# Patient Record
Sex: Male | Born: 1975 | Race: Black or African American | Hispanic: No | Marital: Single | Smoking: Current every day smoker
Health system: Southern US, Community
[De-identification: ages and names within clinical notes are randomized; demographics above are authoritative.]

## PROBLEM LIST (undated history)

## (undated) DIAGNOSIS — I10 Essential (primary) hypertension: Secondary | ICD-10-CM

## (undated) DIAGNOSIS — E119 Type 2 diabetes mellitus without complications: Secondary | ICD-10-CM

## (undated) DIAGNOSIS — E785 Hyperlipidemia, unspecified: Secondary | ICD-10-CM

## (undated) HISTORY — DX: Type 2 diabetes mellitus without complications: E11.9

## (undated) HISTORY — DX: Essential (primary) hypertension: I10

## (undated) HISTORY — DX: Hyperlipidemia, unspecified: E78.5

---

## 2011-09-03 DIAGNOSIS — I10 Essential (primary) hypertension: Secondary | ICD-10-CM | POA: Diagnosis not present

## 2011-09-03 DIAGNOSIS — E785 Hyperlipidemia, unspecified: Secondary | ICD-10-CM | POA: Diagnosis not present

## 2011-09-03 DIAGNOSIS — E559 Vitamin D deficiency, unspecified: Secondary | ICD-10-CM | POA: Diagnosis not present

## 2011-09-03 DIAGNOSIS — J45909 Unspecified asthma, uncomplicated: Secondary | ICD-10-CM | POA: Diagnosis not present

## 2011-09-13 DIAGNOSIS — M25569 Pain in unspecified knee: Secondary | ICD-10-CM | POA: Diagnosis not present

## 2011-09-13 DIAGNOSIS — J209 Acute bronchitis, unspecified: Secondary | ICD-10-CM | POA: Diagnosis not present

## 2011-09-17 DIAGNOSIS — E1149 Type 2 diabetes mellitus with other diabetic neurological complication: Secondary | ICD-10-CM | POA: Diagnosis not present

## 2011-09-17 DIAGNOSIS — E119 Type 2 diabetes mellitus without complications: Secondary | ICD-10-CM | POA: Diagnosis not present

## 2011-12-10 DIAGNOSIS — E785 Hyperlipidemia, unspecified: Secondary | ICD-10-CM | POA: Diagnosis not present

## 2011-12-10 DIAGNOSIS — E559 Vitamin D deficiency, unspecified: Secondary | ICD-10-CM | POA: Diagnosis not present

## 2011-12-10 DIAGNOSIS — E119 Type 2 diabetes mellitus without complications: Secondary | ICD-10-CM | POA: Diagnosis not present

## 2011-12-10 DIAGNOSIS — R7989 Other specified abnormal findings of blood chemistry: Secondary | ICD-10-CM | POA: Diagnosis not present

## 2011-12-12 DIAGNOSIS — E1149 Type 2 diabetes mellitus with other diabetic neurological complication: Secondary | ICD-10-CM | POA: Diagnosis not present

## 2011-12-12 DIAGNOSIS — E119 Type 2 diabetes mellitus without complications: Secondary | ICD-10-CM | POA: Diagnosis not present

## 2012-01-10 DIAGNOSIS — M2390 Unspecified internal derangement of unspecified knee: Secondary | ICD-10-CM | POA: Diagnosis not present

## 2012-02-15 DIAGNOSIS — M25569 Pain in unspecified knee: Secondary | ICD-10-CM | POA: Diagnosis not present

## 2012-02-20 DIAGNOSIS — L851 Acquired keratosis [keratoderma] palmaris et plantaris: Secondary | ICD-10-CM | POA: Diagnosis not present

## 2012-02-20 DIAGNOSIS — E1149 Type 2 diabetes mellitus with other diabetic neurological complication: Secondary | ICD-10-CM | POA: Diagnosis not present

## 2012-03-07 DIAGNOSIS — M2390 Unspecified internal derangement of unspecified knee: Secondary | ICD-10-CM | POA: Diagnosis not present

## 2012-03-10 DIAGNOSIS — G4730 Sleep apnea, unspecified: Secondary | ICD-10-CM | POA: Diagnosis not present

## 2012-03-10 DIAGNOSIS — E785 Hyperlipidemia, unspecified: Secondary | ICD-10-CM | POA: Diagnosis not present

## 2012-03-10 DIAGNOSIS — E119 Type 2 diabetes mellitus without complications: Secondary | ICD-10-CM | POA: Diagnosis not present

## 2014-03-01 ENCOUNTER — Telehealth: Payer: Self-pay | Admitting: Physician Assistant

## 2014-03-02 NOTE — Telephone Encounter (Signed)
appt scheduled. Patient is not considered new to the practice since it has not been 3 years

## 2014-03-03 DIAGNOSIS — E119 Type 2 diabetes mellitus without complications: Secondary | ICD-10-CM | POA: Diagnosis not present

## 2014-03-03 DIAGNOSIS — L851 Acquired keratosis [keratoderma] palmaris et plantaris: Secondary | ICD-10-CM | POA: Diagnosis not present

## 2014-03-03 DIAGNOSIS — E1149 Type 2 diabetes mellitus with other diabetic neurological complication: Secondary | ICD-10-CM | POA: Diagnosis not present

## 2014-03-03 DIAGNOSIS — B351 Tinea unguium: Secondary | ICD-10-CM | POA: Diagnosis not present

## 2014-03-09 ENCOUNTER — Encounter: Payer: Self-pay | Admitting: Family

## 2014-03-09 ENCOUNTER — Ambulatory Visit (INDEPENDENT_AMBULATORY_CARE_PROVIDER_SITE_OTHER): Payer: Medicare Other | Admitting: Family

## 2014-03-09 VITALS — BP 128/78 | HR 85 | Temp 99.3°F | Ht 67.0 in | Wt 254.0 lb

## 2014-03-09 DIAGNOSIS — Z23 Encounter for immunization: Secondary | ICD-10-CM

## 2014-03-09 DIAGNOSIS — Z0000 Encounter for general adult medical examination without abnormal findings: Secondary | ICD-10-CM

## 2014-03-09 DIAGNOSIS — E119 Type 2 diabetes mellitus without complications: Secondary | ICD-10-CM | POA: Diagnosis not present

## 2014-03-09 DIAGNOSIS — E1165 Type 2 diabetes mellitus with hyperglycemia: Secondary | ICD-10-CM

## 2014-03-09 DIAGNOSIS — E559 Vitamin D deficiency, unspecified: Secondary | ICD-10-CM | POA: Diagnosis not present

## 2014-03-09 LAB — POCT GLYCOSYLATED HEMOGLOBIN (HGB A1C): Hemoglobin A1C: 7.6

## 2014-03-09 MED ORDER — METFORMIN HCL 500 MG PO TABS: 500.0000 mg | ORAL_TABLET | Freq: Two times a day (BID) | ORAL | Status: DC

## 2014-03-09 NOTE — Progress Notes (Signed)
   Subjective:    Patient ID: Phillip Barrett, male    DOB: 25-Feb-1976, 38 y.o.   MRN: [id number]  Diabetes He presents for his follow-up diabetic visit. He has type 2 diabetes mellitus. His disease course has been fluctuating. Pertinent negatives for hypoglycemia include no confusion or dizziness. Pertinent negatives for diabetes include no foot paresthesias and no visual change. Symptoms are worsening. Pertinent negatives for diabetic complications include no nephropathy or peripheral neuropathy. Risk factors for coronary artery disease include diabetes mellitus, male sex, sedentary lifestyle, tobacco exposure and dyslipidemia. When asked about current treatments, none were reported. He is following a generally unhealthy diet. He participates in exercise daily. An ACE inhibitor/angiotensin II receptor blocker is not being taken. He sees a podiatrist (One week ago).  *Pt was in prison for two months. Pt states he has been out for the last month and has not taken any medications.    Review of Systems  Constitutional: Negative.   HENT: Negative.   Respiratory: Negative.   Cardiovascular: Negative.   Gastrointestinal: Negative.   Endocrine: Negative.   Genitourinary: Negative.   Musculoskeletal: Negative.   Neurological: Negative.  Negative for dizziness.  Hematological: Negative.   Psychiatric/Behavioral: Negative.  Negative for confusion.  All other systems reviewed and are negative. *     Objective:   Physical Exam  Vitals reviewed. Constitutional: He is oriented to person, place, and time. He appears well-developed and well-nourished. No distress.  HENT:  Head: Normocephalic.  Right Ear: External ear normal.  Left Ear: External ear normal.  Nose: Nose normal.  Mouth/Throat: Oropharynx is clear and moist.  Eyes: Pupils are equal, round, and reactive to light. Right eye exhibits no discharge. Left eye exhibits no discharge.  Neck: Normal range of motion. Neck supple. No thyromegaly  present.  Cardiovascular: Normal rate, regular rhythm, normal heart sounds and intact distal pulses.   No murmur heard. Pulmonary/Chest: Effort normal and breath sounds normal. No respiratory distress. He has no wheezes.  Abdominal: Soft. Bowel sounds are normal. He exhibits no distension. There is no tenderness.  Musculoskeletal: Normal range of motion. He exhibits no edema and no tenderness.  Neurological: He is alert and oriented to person, place, and time. He has normal reflexes. No cranial nerve deficit.  Skin: Skin is warm and dry. No rash noted. No erythema.  Psychiatric: He has a normal mood and affect. His behavior is normal. Judgment and thought content normal.    BP 128/78  Pulse 85  Temp(Src) 99.3 F (37.4 C) (Oral)  Ht _0  (1.702 m)  Wt 254 lb (115.214 kg)  BMI 39.77 kg/m2  See diabetic foot note     Assessment & Plan:  1. Type 2 diabetes mellitus with hyperglycemia - POCT glycosylated hemoglobin (Hb A1C) - CMP14+EGFR - metFORMIN (GLUCOPHAGE) 500 MG tablet; Take 1 tablet (500 mg total) by mouth 2 (two) times daily with a meal.  Dispense: 180 tablet; Refill: 3  2. Annual physical exam - Lipid panel - Vit D  25 hydroxy (rtn osteoporosis monitoring)   Continue all meds Labs pending Health Maintenance reviewed Diet and exercise encouraged RTO 3 month and pt needs appointment with Tammy  Evelina Dun, FNP

## 2014-03-09 NOTE — Patient Instructions (Signed)
How to Avoid Diabetes Problems You can do a lot to prevent or slow down diabetes problems. Following your diabetes plan and taking care of yourself can reduce your risk of serious or life-threatening complications. Below, you will find certain things you can do to prevent diabetes problems. MANAGE YOUR DIABETES Follow your health care provider's, nurse educator's, and dietitian's instructions for managing your diabetes. They will teach you the basics of diabetes care. They can help answer questions you may have. Learn about diabetes and make healthy choices regarding eating and physical activity. Monitor your blood glucose level regularly. Your health care provider will help you decide how often to check your blood glucose level depending on your treatment goals and how well you are meeting them.  DO NOT USE NICOTINE Nicotine and diabetes are a dangerous combination. Nicotine raises your risk for diabetes problems. If you quit using nicotine, you will lower your risk for heart attack, stroke, nerve disease, and kidney disease. Your cholesterol and your blood pressure levels may improve. Your blood circulation will also improve. Do not use any tobacco products, including cigarettes, chewing tobacco, or electronic cigarettes. If you need help quitting, ask your health care provider. KEEP YOUR BLOOD PRESSURE UNDER CONTROL Keeping your blood pressure under control will help prevent damage to your eyes, kidneys, heart, and blood vessels. Blood pressure consists of two numbers. The top number should be below 120, and the bottom number should be below 80 (120/80). Keep your blood pressure as close to these numbers as you can. If you already have kidney disease, you may want even lower blood pressure to protect your kidneys. Talk to your health care provider to make sure that your blood pressure goal is right for your needs. Meal planning, medicines, and exercise can help you reach your blood pressure target. Have  your blood pressure checked at every visit with your health care provider. KEEP YOUR CHOLESTEROL UNDER CONTROL Normal cholesterol levels will help prevent heart disease and stroke. These are the biggest health problems for people with diabetes. Keeping cholesterol levels under control can also help with blood flow. Have your cholesterol level checked at least once a year. Your health care provider may prescribe a medicine known as a statin. Statins lower your cholesterol. If you are not taking a statin, ask your health care provider if you should be. Meal planning, exercise, and medicines can help you reach your cholesterol targets.  SCHEDULE AND KEEP YOUR ANNUAL PHYSICAL EXAMS AND EYE EXAMS Your health care provider will tell you how often he or she wants to see you depending on your plan of treatment. It is important that you keep these appointments so that possible problems can be identified early and complications can be avoided or treated.  Every visit with your health care provider should include your weight, blood pressure, and an evaluation of your blood glucose control.  Your hemoglobin A1c should be checked:  At least twice a year if you are at your goal.  Every 3 months if there are changes in treatment.  If you are not meeting your goals.  Your blood lipids should be checked yearly. You should also be checked yearly to see if you have protein in your urine (microalbumin).  Schedule a dilated eye exam within 5 years of your diagnosis if you have type 1 diabetes, and then yearly. Schedule a dilated eye exam at diagnosis if you have type 2 diabetes, and then yearly. All exams thereafter can be extended to every 2  to 3 years if one or more exams have been normal. KEEP YOUR VACCINES CURRENT The flu vaccine is recommended yearly. The formula for the vaccine changes every year and needs to be updated for the best protection against current viruses. It is recommended that people with diabetes  who are over 38 years old get the pneumonia vaccine. In some cases, two separate shots may be given. Ask your health care provider if your pneumonia vaccination is up-to-date. However, there are some instances where another vaccine is recommended. Check with your health care provider. TAKE CARE OF YOUR FEET  Diabetes may cause you to have a poor blood supply (circulation) to your legs and feet. Because of this, the skin may be thinner, break easier, and heal more slowly. You also may have nerve damage in your legs and feet, causing decreased feeling. You may not notice minor injuries to your feet that could lead to serious problems or infections. Taking care of your feet is very important. Visual foot exams are performed at every routine medical visit. The exams check for cuts, injuries, or other problems with the feet. A comprehensive foot exam should be done yearly. This includes visual inspection as well as assessing foot pulses and testing for loss of sensation. You should also do the following:  Inspect your feet daily for cuts, calluses, blisters, ingrown toenails, and signs of infection, such as redness, swelling, or pus.  Wash and dry your feet thoroughly, especially between the toes.  Avoid soaking your feet regularly in hot water baths.  Moisturize dry skin with lotion, avoiding areas between your toes.  Cut toenails straight across and file the edges.  Avoid shoes that do not fit well or have areas that irritate your skin.  Avoid going barefooted or wearing only socks. Your feet need protection. TAKE CARE OF YOUR TEETH People with poorly controlled diabetes are more likely to have gum (periodontal) disease. These infections make diabetes harder to control. Periodontal diseases, if left untreated, can lead to tooth loss. Brush your teeth twice a day, floss, and see your dentist for checkups and cleaning every 6 months, or 2 times a year. ASK YOUR HEALTH CARE PROVIDER ABOUT TAKING  ASPIRIN Taking aspirin daily is recommended to help prevent cardiovascular disease in people with and without diabetes. Ask your health care provider if this would benefit you and what dose he or she would recommend. DRINK RESPONSIBLY Moderate amounts of alcohol (less than 1 drink per day for adult women and less than 2 drinks per day for adult men) have a minimal effect on blood glucose if ingested with food. It is important to eat food with alcohol to avoid hypoglycemia. People should avoid alcohol if they have a history of alcohol abuse or dependence, if they are pregnant, and if they have liver disease, pancreatitis, advanced neuropathy, or severe hypertriglyceridemia. LESSEN STRESS Living with diabetes can be stressful. When you are under stress, your blood glucose may be affected in two ways:  Stress hormones may cause your blood glucose to rise.  You may be distracted from taking good care of yourself. It is a good idea to be aware of your stress level and make changes that are necessary to help you better manage challenging situations. Support groups, planned relaxation, a hobby you enjoy, meditation, healthy relationships, and exercise all work to lower your stress level. If your efforts do not seem to be helping, get help from your health care provider or a trained mental health professional. Document  Released: 04/03/2011 Document Revised: 11/30/2013 Document Reviewed: 09/09/2013 [facility] Patient Information 2015 [place], [place]. This information is not intended to replace advice given to you by your health care provider. Make sure you discuss any questions you have with your health care provider.

## 2014-03-09 NOTE — Addendum Note (Signed)
Addended by: Zannie Cove on: 03/09/2014 03:31 PM   Modules accepted: Orders

## 2014-03-10 ENCOUNTER — Other Ambulatory Visit: Payer: Self-pay | Admitting: Family

## 2014-03-10 LAB — CMP14+EGFR
ALBUMIN: 4.3 g/dL (ref 3.5–5.5)
ALK PHOS: 65 IU/L (ref 39–117)
ALT: 25 IU/L (ref 0–44)
AST: 19 IU/L (ref 0–40)
Albumin/Globulin Ratio: 1.4 (ref 1.1–2.5)
BILIRUBIN TOTAL: 0.2 mg/dL (ref 0.0–1.2)
BUN/Creatinine Ratio: 10 (ref 8–19)
BUN: 9 mg/dL (ref 6–20)
CHLORIDE: 97 mmol/L (ref 97–108)
CO2: 23 mmol/L (ref 18–29)
Calcium: 9.3 mg/dL (ref 8.7–10.2)
Creatinine, Ser: 0.87 mg/dL (ref 0.76–1.27)
GFR calc non Af Amer: 109 mL/min/{1.73_m2} (ref >59)
GFR, EST AFRICAN AMERICAN: 127 mL/min/{1.73_m2} (ref >59)
Globulin, Total: 3 g/dL (ref 1.5–4.5)
Glucose: 272 mg/dL — ABNORMAL HIGH (ref 65–99)
POTASSIUM: 4.7 mmol/L (ref 3.5–5.2)
Sodium: 135 mmol/L (ref 134–144)
Total Protein: 7.3 g/dL (ref 6.0–8.5)

## 2014-03-10 LAB — LIPID PANEL
CHOLESTEROL TOTAL: 187 mg/dL (ref 100–199)
Chol/HDL Ratio: 3.5 ratio units (ref 0.0–5.0)
HDL: 53 mg/dL (ref >39)
LDL CALC: 117 mg/dL — AB (ref 0–99)
Triglycerides: 86 mg/dL (ref 0–149)
VLDL Cholesterol Cal: 17 mg/dL (ref 5–40)

## 2014-03-10 LAB — VITAMIN D 25 HYDROXY (VIT D DEFICIENCY, FRACTURES): Vit D, 25-Hydroxy: 23.9 ng/mL — ABNORMAL LOW (ref 30.0–100.0)

## 2014-03-10 MED ORDER — SIMVASTATIN 20 MG PO TABS: 20.0000 mg | ORAL_TABLET | Freq: Every day | ORAL | Status: DC

## 2014-03-10 MED ORDER — VITAMIN D (ERGOCALCIFEROL) 1.25 MG (50000 UNIT) PO CAPS: 50000.0000 [IU] | ORAL_CAPSULE | ORAL | Status: DC

## 2014-03-11 ENCOUNTER — Telehealth: Payer: Self-pay | Admitting: Family Medicine

## 2014-03-11 NOTE — Telephone Encounter (Signed)
Message copied by Waverly Ferrari on Thu Mar 11, 2014  9:33 AM ------      Message from: Lenna Gilford, [place] A      Created: Wed Mar 10, 2014 10:39 AM       HgbA1 and blood sugar  Elevated-However, pt has been out of medication for last month- Start taking medication and will check on next visit before adding any new medications      Kidney and liver function stable      Cholesterol levels high- RX sent to pharmacy      Vit D levels low- RX sent to pharmacy ------

## 2014-03-18 ENCOUNTER — Encounter: Payer: Self-pay | Admitting: Family Medicine

## 2014-03-24 ENCOUNTER — Telehealth: Payer: Self-pay | Admitting: Family

## 2014-03-24 NOTE — Telephone Encounter (Signed)
Patient received letter about labs and wanted to review. Discussed labs with patient. Verbalized understanding

## 2014-04-01 ENCOUNTER — Encounter: Payer: Self-pay | Admitting: Pharmacist

## 2014-04-01 ENCOUNTER — Ambulatory Visit (INDEPENDENT_AMBULATORY_CARE_PROVIDER_SITE_OTHER): Payer: Medicare Other | Admitting: Pharmacist

## 2014-04-01 VITALS — BP 130/80 | HR 87 | Ht 67.0 in | Wt 261.0 lb

## 2014-04-01 DIAGNOSIS — IMO0001 Reserved for inherently not codable concepts without codable children: Secondary | ICD-10-CM

## 2014-04-01 DIAGNOSIS — E782 Mixed hyperlipidemia: Secondary | ICD-10-CM

## 2014-04-01 DIAGNOSIS — R635 Abnormal weight gain: Secondary | ICD-10-CM | POA: Diagnosis not present

## 2014-04-01 DIAGNOSIS — E785 Hyperlipidemia, unspecified: Secondary | ICD-10-CM | POA: Insufficient documentation

## 2014-04-01 DIAGNOSIS — E669 Obesity, unspecified: Secondary | ICD-10-CM

## 2014-04-01 DIAGNOSIS — E1165 Type 2 diabetes mellitus with hyperglycemia: Secondary | ICD-10-CM

## 2014-04-01 MED ORDER — FREESTYLE SYSTEM KIT: 1.0000 | PACK | Status: DC | PRN

## 2014-04-01 MED ORDER — FREESTYLE SYSTEM KIT: PACK | Status: DC

## 2014-04-01 MED ORDER — LANCETS MICRO THIN 33G MISC: Status: DC

## 2014-04-01 MED ORDER — BLOOD GLUCOSE TEST VI STRP: ORAL_STRIP | Status: DC

## 2014-04-01 NOTE — Patient Instructions (Signed)
Diabetes and Standards of Medical Care  Diabetes is complicated. You may find that your diabetes team includes a dietitian, nurse, diabetes educator, eye doctor, and more. To help everyone know what is going on and to help you get the care you deserve, the following schedule of care was developed to help keep you on track. Below are the tests, exams, vaccines, medicines, education, and plans you will need.  Blood Glucose Goals Prior to meals = 80 - 130 Within 2 hours of the start of a meal = less than 180  HbA1c test (goal is less than 7.0% - your last value was 7.6%) This test shows how well you have controlled your glucose over the past 2 3 months. It is used to see if your diabetes management plan needs to be adjusted.   It is performed at least 2 times a year if you are meeting treatment goals.  It is performed 4 times a year if therapy has changed or if you are not meeting treatment goals.   Blood pressure test  This test is performed at every routine medical visit. The goal is less than 140/90 mmHg for most people, but 130/80 mmHg in some cases. Ask your health care provider about your goal. Dental exam  Follow up with the dentist regularly. Eye exam  If you are diagnosed with type 1 diabetes as a child, get an exam upon reaching the age of 53 years or older and have had diabetes for 3 5 years. Yearly eye exams are recommended after that initial eye exam.  If you are diagnosed with type 1 diabetes as an adult, get an exam within 5 years of diagnosis and then yearly.  If you are diagnosed with type 2 diabetes, get an exam as soon as possible after the diagnosis and then yearly. Foot care exam  Visual foot exams are performed at every routine medical visit. The exams check for cuts, injuries, or other problems with the feet.  A comprehensive foot exam should be done yearly. This includes visual inspection as well as assessing foot pulses and testing for loss of sensation.  Check  your feet nightly for cuts, injuries, or other problems with your feet. Tell your health care provider if anything is not healing. Kidney function test (urine microalbumin)  This test is performed once a year.  Type 1 diabetes: The first test is performed 5 years after diagnosis.  Type 2 diabetes: The first test is performed at the time of diagnosis.  A serum creatinine and estimated glomerular filtration rate (eGFR) test is done once a year to assess the level of chronic kidney disease (CKD), if present. Lipid profile (cholesterol, HDL, LDL, triglycerides)  Performed every 5 years for most people.  The goal for LDL is less than 100 mg/dL. If you are at high risk, the goal is less than 70 mg/dL.  The goal for HDL is 40 mg/dL 50 mg/dL for men and 50 mg/dL 60 mg/dL for women. An HDL cholesterol of 60 mg/dL or higher gives some protection against heart disease.  The goal for triglycerides is less than 150 mg/dL. Influenza vaccine, pneumococcal vaccine, and hepatitis B vaccine  The influenza vaccine is recommended yearly.  The pneumococcal vaccine is generally given once in a lifetime. However, there are some instances when another vaccination is recommended. Check with your health care provider.  The hepatitis B vaccine is also recommended for adults with diabetes. Diabetes self-management education  Education is recommended at diagnosis and ongoing  as needed. Treatment plan  Your treatment plan is reviewed at every medical visit. Document Released: 05/13/2009 Document Revised: 03/18/2013 Document Reviewed: 12/16/2012 [facility] [address] Patient Information 2014 [place].  Hypoglycemia Hypoglycemia occurs when the glucose in your blood is too low. Glucose is a type of sugar that is your body's main energy source. Hormones, such as insulin and glucagon, control the level of glucose in the blood. Insulin lowers blood glucose and glucagon increases blood glucose. Having too much insulin in  your blood stream, or not eating enough food containing sugar, can result in hypoglycemia. Hypoglycemia can happen to people with or without diabetes. It can develop quickly and can be a medical emergency.  CAUSES   Missing or delaying meals.  Not eating enough carbohydrates at meals.  Taking too much diabetes medicine.  Not timing your oral diabetes medicine or insulin doses with meals, snacks, and exercise.  Nausea and vomiting.  Certain medicines.  Severe illnesses, such as hepatitis, kidney disorders, and certain eating disorders.  Increased activity or exercise without eating something extra or adjusting medicines.  Drinking too much alcohol.  A nerve disorder that affects body functions like your heart rate, blood pressure, and digestion (autonomic neuropathy).  A condition where the stomach muscles do not function properly (gastroparesis). Therefore, medicines and food may not absorb properly.  Rarely, a tumor of the pancreas can produce too much insulin. SYMPTOMS   Hunger.  Sweating (diaphoresis).  Change in body temperature.  Shakiness.  Headache.  Anxiety.  Lightheadedness.  Irritability.  Difficulty concentrating.  Dry mouth.  Tingling or numbness in the hands or feet.  Restless sleep or sleep disturbances.  Altered speech and coordination.  Change in mental status.  Seizures or prolonged convulsions.  Combativeness.  Drowsiness (lethargic).  Weakness.  Increased heart rate or palpitations.  Confusion.  Pale, gray skin color.  Blurred or double vision.  Fainting. DIAGNOSIS  A physical exam and medical history will be performed. Your caregiver may make a diagnosis based on your symptoms. Blood tests and other lab tests may be performed to confirm a diagnosis. Once the diagnosis is made, your caregiver will see if your signs and symptoms go away once your blood glucose is raised.  TREATMENT  Usually, you can easily treat your  hypoglycemia when you notice symptoms.  Check your blood glucose. If it is less than 70 mg/dl, take one of the following:   3-4 glucose tablets.    cup juice.    cup regular soda.   1 cup skim milk.   -1 tube of glucose gel.   5-6 hard candies.   Avoid high-fat drinks or food that may delay a rise in blood glucose levels.  Do not take more than the recommended amount of sugary foods, drinks, gel, or tablets. Doing so will cause your blood glucose to go too high.   Wait 10-15 minutes and recheck your blood glucose. If it is still less than 70 mg/dl or below your target range, repeat treatment.   Eat a snack if it is more than 1 hour until your next meal.  There may be a time when your blood glucose may go so low that you are unable to treat yourself at home when you start to notice symptoms. You may need someone to help you. You may even faint or be unable to swallow. If you cannot treat yourself, someone will need to bring you to the hospital.  [place]  If you have diabetes, follow your  diabetes management plan by:  Taking your medicines as directed.  Following your exercise plan.  Following your meal plan. Do not skip meals. Eat on time.  Testing your blood glucose regularly. Check your blood glucose before and after exercise. If you exercise longer or different than usual, be sure to check blood glucose more frequently.  Wearing your medical alert jewelry that says you have diabetes.  Identify the cause of your hypoglycemia. Then, develop ways to prevent the recurrence of hypoglycemia.  Do not take a hot bath or shower right after an insulin shot.  Always carry treatment with you. Glucose tablets are the easiest to carry.  If you are going to drink alcohol, drink it only with meals.  Tell friends or family members ways to keep you safe during a seizure. This may include removing hard or sharp objects from the area or turning you on your  side.  Maintain a healthy weight. SEEK MEDICAL CARE IF:   You are having problems keeping your blood glucose in your target range.  You are having frequent episodes of hypoglycemia.  You feel you might be having side effects from your medicines.  You are not sure why your blood glucose is dropping so low.  You notice a change in vision or a new problem with your vision. SEEK IMMEDIATE MEDICAL CARE IF:   Confusion develops.  A change in mental status occurs.  The inability to swallow develops.  Fainting occurs. Document Released: 07/16/2005 Document Revised: 07/21/2013 Document Reviewed: 11/12/2011 [facility] Patient Information 2015 [place], [place]. This information is not intended to replace advice given to you by your health care provider. Make sure you discuss any questions you have with your health care provider.

## 2014-04-01 NOTE — Progress Notes (Signed)
Subjective:    Phillip Barrett is a 38 y.o. male who presents for evaluation of Type 2 diabetes mellitus and diabetes education. Current symptoms/problems include hyperglycemia and polydipsia and have been worsening. Symptoms have been present for 4 months.  The patient was initially diagnosed with Type 2 diabetes mellitus over 10 years ago.  He has been incarcerated over the last year and not had regular follow up visits.  Known diabetic complications: peripheral neuropathy Cardiovascular risk factors: diabetes mellitus, dyslipidemia, male gender and obesity (BMI >= 30 kg/m2) Current diabetic medications include metformin 500mg  bid - recently restarted about 3 weeks ago..   Eye exam current (within one year): no Weight trend: increasing steadily Prior visit with dietician: yes -  Over 5 years ago Current diet: in general, an "unhealthy" diet.  Drinking lots of soda. Current exercise: none  Current monitoring regimen: none.  Patient has a glucometer but no test strips. Home blood sugar records: none Any episodes of hypoglycemia? no  Is He on ACE inhibitor or angiotensin II receptor blocker?  No   The following portions of the patient's history were reviewed and updated as appropriate: allergies, current medications, past family history, past medical history, past social history, past surgical history and problem list.     Objective:    BP 130/80  Pulse 87  Ht 5\' 7"  (1.702 m)  Wt 261 lb (118.389 kg)  BMI 40.87 kg/m2   Lab Review Glucose (mg/dL)  Date Value  03/09/2014 272*     CO2 (mmol/L)  Date Value  03/09/2014 23      BUN (mg/dL)  Date Value  03/09/2014 9      Creatinine, Ser (mg/dL)  Date Value  03/09/2014 0.87    A1c was 7.6% (03/09/2014)  Assessment:    Diabetes Mellitus type II, under poor control.  Hyperlipidemia - restarted simvastatin 3 weeks ago Obesity - current poor diet   Plan:    1.  Rx changes: none 2.  Education: Reviewed 'ABCs' of diabetes  management (respective goals in parentheses):  A1C (<7), blood pressure (<130/80), and cholesterol (LDL <100). 3.  Compliance at present is estimated to be poor.   Efforts to improve compliance (if necessary) will be directed at dietary modifications: CHO counting diet reviewed and discussed at length (about 20 minutes), increased exercise and regular blood sugar monitoring: two times daily 4. Follow up: 8 weeks    Cherre Robins, PharmD, CPP, CDE

## 2014-04-07 ENCOUNTER — Telehealth: Payer: Self-pay | Admitting: Family

## 2014-04-09 ENCOUNTER — Encounter: Payer: Self-pay | Admitting: *Deleted

## 2014-04-15 NOTE — Telephone Encounter (Signed)
Braces forms faxed in 04/12/14

## 2014-04-26 ENCOUNTER — Ambulatory Visit (INDEPENDENT_AMBULATORY_CARE_PROVIDER_SITE_OTHER): Payer: Medicare Other

## 2014-04-26 DIAGNOSIS — Z23 Encounter for immunization: Secondary | ICD-10-CM | POA: Diagnosis not present

## 2014-05-12 DIAGNOSIS — E1142 Type 2 diabetes mellitus with diabetic polyneuropathy: Secondary | ICD-10-CM | POA: Diagnosis not present

## 2014-05-12 DIAGNOSIS — B351 Tinea unguium: Secondary | ICD-10-CM | POA: Diagnosis not present

## 2014-05-12 DIAGNOSIS — L110 Acquired keratosis follicularis: Secondary | ICD-10-CM | POA: Diagnosis not present

## 2014-05-12 DIAGNOSIS — E1140 Type 2 diabetes mellitus with diabetic neuropathy, unspecified: Secondary | ICD-10-CM | POA: Diagnosis not present

## 2014-06-08 ENCOUNTER — Telehealth: Payer: Self-pay | Admitting: Family Medicine

## 2014-06-08 DIAGNOSIS — S0990XA Unspecified injury of head, initial encounter: Secondary | ICD-10-CM | POA: Diagnosis not present

## 2014-06-08 DIAGNOSIS — E1165 Type 2 diabetes mellitus with hyperglycemia: Secondary | ICD-10-CM | POA: Diagnosis not present

## 2014-06-08 DIAGNOSIS — S40012A Contusion of left shoulder, initial encounter: Secondary | ICD-10-CM | POA: Diagnosis not present

## 2014-06-08 DIAGNOSIS — M25512 Pain in left shoulder: Secondary | ICD-10-CM | POA: Diagnosis not present

## 2014-06-08 DIAGNOSIS — Z79899 Other long term (current) drug therapy: Secondary | ICD-10-CM | POA: Diagnosis not present

## 2014-06-08 DIAGNOSIS — S199XXA Unspecified injury of neck, initial encounter: Secondary | ICD-10-CM | POA: Diagnosis not present

## 2014-06-08 DIAGNOSIS — S4992XA Unspecified injury of left shoulder and upper arm, initial encounter: Secondary | ICD-10-CM | POA: Diagnosis not present

## 2014-06-08 DIAGNOSIS — F17200 Nicotine dependence, unspecified, uncomplicated: Secondary | ICD-10-CM | POA: Diagnosis not present

## 2014-06-08 DIAGNOSIS — S0001XA Abrasion of scalp, initial encounter: Secondary | ICD-10-CM | POA: Diagnosis not present

## 2014-06-08 DIAGNOSIS — R51 Headache: Secondary | ICD-10-CM | POA: Diagnosis not present

## 2014-06-08 DIAGNOSIS — R739 Hyperglycemia, unspecified: Secondary | ICD-10-CM | POA: Diagnosis not present

## 2014-06-08 NOTE — Telephone Encounter (Signed)
Patient aware dr. Sabra Heck is not in the office on Thursday

## 2014-06-10 ENCOUNTER — Ambulatory Visit: Payer: Medicare Other | Admitting: Family Medicine

## 2014-06-11 ENCOUNTER — Encounter: Payer: Self-pay | Admitting: Family Medicine

## 2014-06-11 ENCOUNTER — Ambulatory Visit (INDEPENDENT_AMBULATORY_CARE_PROVIDER_SITE_OTHER): Payer: Medicare Other | Admitting: Family Medicine

## 2014-06-11 VITALS — BP 107/78 | HR 72 | Temp 98.6°F | Ht 67.0 in | Wt 260.0 lb

## 2014-06-11 DIAGNOSIS — E119 Type 2 diabetes mellitus without complications: Secondary | ICD-10-CM

## 2014-06-11 DIAGNOSIS — E785 Hyperlipidemia, unspecified: Secondary | ICD-10-CM

## 2014-06-11 DIAGNOSIS — E1165 Type 2 diabetes mellitus with hyperglycemia: Secondary | ICD-10-CM | POA: Diagnosis not present

## 2014-06-11 DIAGNOSIS — IMO0002 Reserved for concepts with insufficient information to code with codable children: Secondary | ICD-10-CM

## 2014-06-11 LAB — POCT GLYCOSYLATED HEMOGLOBIN (HGB A1C): HEMOGLOBIN A1C: 13.5

## 2014-06-11 MED ORDER — HYDROCODONE-ACETAMINOPHEN 5-300 MG PO TABS: 5.0000 mg | ORAL_TABLET | Freq: Two times a day (BID) | ORAL | Status: DC | PRN

## 2014-06-11 MED ORDER — LISINOPRIL 5 MG PO TABS: 5.0000 mg | ORAL_TABLET | Freq: Every day | ORAL | Status: DC

## 2014-06-11 NOTE — Addendum Note (Signed)
Addended by: Wardell Honour on: 06/11/2014 11:02 AM   Modules accepted: Orders

## 2014-06-11 NOTE — Patient Instructions (Signed)
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
## 2014-06-14 ENCOUNTER — Ambulatory Visit: Payer: Medicare Other | Admitting: Family Medicine

## 2014-06-14 ENCOUNTER — Telehealth: Payer: Self-pay | Admitting: Family Medicine

## 2014-06-14 NOTE — Telephone Encounter (Signed)
Pt given appt with Dietrich Pates Friday @ 10:30

## 2014-06-18 ENCOUNTER — Encounter: Payer: Self-pay | Admitting: Family Medicine

## 2014-06-18 ENCOUNTER — Ambulatory Visit (INDEPENDENT_AMBULATORY_CARE_PROVIDER_SITE_OTHER): Payer: Medicare Other | Admitting: Family Medicine

## 2014-06-18 ENCOUNTER — Telehealth: Payer: Self-pay | Admitting: Family Medicine

## 2014-06-18 DIAGNOSIS — M25561 Pain in right knee: Secondary | ICD-10-CM | POA: Diagnosis not present

## 2014-06-18 MED ORDER — NAPROXEN 500 MG PO TABS: 500.0000 mg | ORAL_TABLET | Freq: Two times a day (BID) | ORAL | Status: DC

## 2014-06-18 MED ORDER — CYCLOBENZAPRINE HCL 10 MG PO TABS: 10.0000 mg | ORAL_TABLET | Freq: Three times a day (TID) | ORAL | Status: DC | PRN
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
## 2014-06-18 NOTE — Telephone Encounter (Signed)
I advised patient that it was probably to help protect his kidneys from the diabetes but he wants to just verify this

## 2014-07-02 ENCOUNTER — Telehealth: Payer: Self-pay | Admitting: Family Medicine

## 2014-07-02 NOTE — Telephone Encounter (Signed)
Patient script for metformin was changed to 1000 mg BID   per Dr. Sabra Heck..   It had been 500 mg BID.

## 2014-07-28 DIAGNOSIS — B351 Tinea unguium: Secondary | ICD-10-CM | POA: Diagnosis not present

## 2014-07-28 DIAGNOSIS — E1142 Type 2 diabetes mellitus with diabetic polyneuropathy: Secondary | ICD-10-CM | POA: Diagnosis not present

## 2014-07-28 DIAGNOSIS — L110 Acquired keratosis follicularis: Secondary | ICD-10-CM | POA: Diagnosis not present

## 2014-09-13 ENCOUNTER — Ambulatory Visit: Payer: Medicare Other | Admitting: Family

## 2014-10-06 DIAGNOSIS — B351 Tinea unguium: Secondary | ICD-10-CM | POA: Diagnosis not present

## 2014-10-06 DIAGNOSIS — L110 Acquired keratosis follicularis: Secondary | ICD-10-CM | POA: Diagnosis not present

## 2014-10-06 DIAGNOSIS — E1142 Type 2 diabetes mellitus with diabetic polyneuropathy: Secondary | ICD-10-CM | POA: Diagnosis not present

## 2014-10-28 ENCOUNTER — Ambulatory Visit: Payer: Medicare Other | Admitting: Family

## 2014-11-10 ENCOUNTER — Ambulatory Visit: Payer: Medicare Other | Admitting: Family

## 2014-11-24 ENCOUNTER — Ambulatory Visit (INDEPENDENT_AMBULATORY_CARE_PROVIDER_SITE_OTHER): Payer: Medicare Other | Admitting: Family

## 2014-11-24 ENCOUNTER — Encounter: Payer: Self-pay | Admitting: Family

## 2014-11-24 VITALS — BP 112/67 | HR 71 | Temp 97.2°F | Ht 67.0 in | Wt 253.0 lb

## 2014-11-24 DIAGNOSIS — E119 Type 2 diabetes mellitus without complications: Secondary | ICD-10-CM

## 2014-11-24 DIAGNOSIS — M545 Low back pain, unspecified: Secondary | ICD-10-CM

## 2014-11-24 DIAGNOSIS — E1165 Type 2 diabetes mellitus with hyperglycemia: Secondary | ICD-10-CM

## 2014-11-24 DIAGNOSIS — E785 Hyperlipidemia, unspecified: Secondary | ICD-10-CM

## 2014-11-24 DIAGNOSIS — E559 Vitamin D deficiency, unspecified: Secondary | ICD-10-CM | POA: Diagnosis not present

## 2014-11-24 LAB — POCT UA - MICROALBUMIN: MICROALBUMIN (UR) POC: NEGATIVE mg/L

## 2014-11-24 LAB — POCT GLYCOSYLATED HEMOGLOBIN (HGB A1C): HEMOGLOBIN A1C: 6

## 2014-11-24 MED ORDER — MELOXICAM 15 MG PO TABS: 15.0000 mg | ORAL_TABLET | Freq: Every day | ORAL | Status: DC

## 2014-11-24 MED ORDER — METFORMIN HCL 500 MG PO TABS: 500.0000 mg | ORAL_TABLET | Freq: Two times a day (BID) | ORAL | Status: DC

## 2014-11-24 NOTE — Patient Instructions (Signed)
Type 2 Diabetes Mellitus Type 2 diabetes mellitus, often simply referred to as type 2 diabetes, is a long-lasting (chronic) disease. In type 2 diabetes, the pancreas does not make enough insulin (a hormone), the cells are less responsive to the insulin that is made (insulin resistance), or both. Normally, insulin moves sugars from food into the tissue cells. The tissue cells use the sugars for energy. The lack of insulin or the lack of normal response to insulin causes excess sugars to build up in the blood instead of going into the tissue cells. As a result, high blood sugar (hyperglycemia) develops. The effect of high sugar (glucose) levels can cause many complications. Type 2 diabetes was also previously called adult-onset diabetes, but it can occur at any age.  RISK FACTORS  A person is predisposed to developing type 2 diabetes if someone in the family has the disease and also has one or more of the following primary risk factors:  Overweight.  An inactive lifestyle.  A history of consistently eating high-calorie foods. Maintaining a normal weight and regular physical activity can reduce the chance of developing type 2 diabetes. SYMPTOMS  A person with type 2 diabetes may not show symptoms initially. The symptoms of type 2 diabetes appear slowly. The symptoms include:  Increased thirst (polydipsia).  Increased urination (polyuria).  Increased urination during the night (nocturia).  Weight loss. This weight loss may be rapid.  Frequent, recurring infections.  Tiredness (fatigue).  Weakness.  Vision changes, such as blurred vision.  Fruity smell to your breath.  Abdominal pain.  Nausea or vomiting.  Cuts or bruises which are slow to heal.  Tingling or numbness in the hands or feet. DIAGNOSIS Type 2 diabetes is frequently not diagnosed until complications of diabetes are present. Type 2 diabetes is diagnosed when symptoms or complications are present and when blood  glucose levels are increased. Your blood glucose level may be checked by one or more of the following blood tests:  A fasting blood glucose test. You will not be allowed to eat for at least 8 hours before a blood sample is taken.  A random blood glucose test. Your blood glucose is checked at any time of the day regardless of when you ate.  A hemoglobin A1c blood glucose test. A hemoglobin A1c test provides information about blood glucose control over the previous 3 months.  An oral glucose tolerance test (OGTT). Your blood glucose is measured after you have not eaten (fasted) for 2 hours and then after you drink a glucose-containing beverage. TREATMENT   You may need to take insulin or diabetes medicine daily to keep blood glucose levels in the desired range.  If you use insulin, you may need to adjust the dosage depending on the carbohydrates that you eat with each meal or snack. The treatment goal is to maintain the before meal blood sugar (preprandial glucose) level at 70-130 mg/dL. HOME CARE INSTRUCTIONS   Have your hemoglobin A1c level checked twice a year.  Perform daily blood glucose monitoring as directed by your health care provider.  Monitor urine ketones when you are ill and as directed by your health care provider.  Take your diabetes medicine or insulin as directed by your health care provider to maintain your blood glucose levels in the desired range.  Never run out of diabetes medicine or insulin. It is needed every day.  If you are using insulin, you may need to adjust the amount of insulin given based on your intake of   carbohydrates. Carbohydrates can raise blood glucose levels but need to be included in your diet. Carbohydrates provide vitamins, minerals, and fiber which are an essential part of a healthy diet. Carbohydrates are found in fruits, vegetables, whole grains, dairy products, legumes, and foods containing added sugars.  Eat healthy foods. You should make an  appointment to see a registered dietitian to help you create an eating plan that is right for you.  Lose weight if you are overweight.  Carry a medical alert card or wear your medical alert jewelry.  Carry a 15-gram carbohydrate snack with you at all times to treat low blood glucose (hypoglycemia). Some examples of 15-gram carbohydrate snacks include:  Glucose tablets, 3 or 4.  Glucose gel, 15-gram tube.  Raisins, 2 tablespoons (24 grams).  Jelly beans, 6.  Animal crackers, 8.  Regular pop, 4 ounces (120 mL).  Gummy treats, 9.  Recognize hypoglycemia. Hypoglycemia occurs with blood glucose levels of 70 mg/dL and below. The risk for hypoglycemia increases when fasting or skipping meals, during or after intense exercise, and during sleep. Hypoglycemia symptoms can include:  Tremors or shakes.  Decreased ability to concentrate.  Sweating.  Increased heart rate.  Headache.  Dry mouth.  Hunger.  Irritability.  Anxiety.  Restless sleep.  Altered speech or coordination.  Confusion.  Treat hypoglycemia promptly. If you are alert and able to safely swallow, follow the 15:15 rule:  Take 15-20 grams of rapid-acting glucose or carbohydrate. Rapid-acting options include glucose gel, glucose tablets, or 4 ounces (120 mL) of fruit juice, regular soda, or low-fat milk.  Check your blood glucose level 15 minutes after taking the glucose.  Take 15-20 grams more of glucose if the repeat blood glucose level is still 70 mg/dL or below.  Eat a meal or snack within 1 hour once blood glucose levels return to normal.  Be alert to feeling very thirsty and urinating more frequently than usual, which are early signs of hyperglycemia. An early awareness of hyperglycemia allows for prompt treatment. Treat hyperglycemia as directed by your health care provider.  Engage in at least 150 minutes of moderate-intensity physical activity a week, spread over at least 3 days of the week or as  directed by your health care provider. In addition, you should engage in resistance exercise at least 2 times a week or as directed by your health care provider. Try to spend no more than 90 minutes at one time inactive.  Adjust your medicine and food intake as needed if you start a new exercise or sport.  Follow your sick-day plan anytime you are unable to eat or drink as usual.  Do not use any tobacco products including cigarettes, chewing tobacco, or electronic cigarettes. If you need help quitting, ask your health care provider.  Limit alcohol intake to no more than 1 drink per day for nonpregnant women and 2 drinks per day for men. You should drink alcohol only when you are also eating food. Talk with your health care provider whether alcohol is safe for you. Tell your health care provider if you drink alcohol several times a week.  Keep all follow-up visits as directed by your health care provider. This is important.  Schedule an eye exam soon after the diagnosis of type 2 diabetes and then annually.  Perform daily skin and foot care. Examine your skin and feet daily for cuts, bruises, redness, nail problems, bleeding, blisters, or sores. A foot exam by a health care provider should be done annually.    Brush your teeth and gums at least twice a day and floss at least once a day. Follow up with your dentist regularly.  Share your diabetes management plan with your workplace or school.  Stay up-to-date with immunizations. It is recommended that people with diabetes who are over 57 years old get the pneumonia vaccine. In some cases, two separate shots may be given. Ask your health care provider if your pneumonia vaccination is up-to-date.  Learn to manage stress.  Obtain ongoing diabetes education and support as needed.  Participate in or seek rehabilitation as needed to maintain or improve independence and quality of life. Request a physical or occupational therapy referral if you are  having foot or hand numbness, or difficulties with grooming, dressing, eating, or physical activity. SEEK MEDICAL CARE IF:   You are unable to eat food or drink fluids for more than 6 hours.  You have nausea and vomiting for more than 6 hours.  Your blood glucose level is over 240 mg/dL.  There is a change in mental status.  You develop an additional serious illness.  You have diarrhea for more than 6 hours.  You have been sick or have had a fever for a couple of days and are not getting better.  You have pain during any physical activity.  SEEK IMMEDIATE MEDICAL CARE IF:  You have difficulty breathing.  You have moderate to large ketone levels. MAKE SURE YOU:  Understand these instructions.  Will watch your condition.  Will get help right away if you are not doing well or get worse. Document Released: 07/16/2005 Document Revised: 11/30/2013 Document Reviewed: 02/12/2012 [facility] Patient Information 2015 [place], [place]. This information is not intended to replace advice given to you by your health care provider. Make sure you discuss any questions you have with your health care provider. Diabetes Mellitus and Food It is important for you to manage your blood sugar (glucose) level. Your blood glucose level can be greatly affected by what you eat. Eating healthier foods in the appropriate amounts throughout the day at about the same time each day will help you control your blood glucose level. It can also help slow or prevent worsening of your diabetes mellitus. Healthy eating may even help you improve the level of your blood pressure and reach or maintain a healthy weight.  HOW CAN FOOD AFFECT ME? Carbohydrates Carbohydrates affect your blood glucose level more than any other type of food. Your dietitian will help you determine how many carbohydrates to eat at each meal and teach you how to count carbohydrates. Counting carbohydrates is important to keep your blood glucose at a  healthy level, especially if you are using insulin or taking certain medicines for diabetes mellitus. Alcohol Alcohol can cause sudden decreases in blood glucose (hypoglycemia), especially if you use insulin or take certain medicines for diabetes mellitus. Hypoglycemia can be a life-threatening condition. Symptoms of hypoglycemia (sleepiness, dizziness, and disorientation) are similar to symptoms of having too much alcohol.  If your health care provider has given you approval to drink alcohol, do so in moderation and use the following guidelines:  Women should not have more than one drink per day, and men should not have more than two drinks per day. One drink is equal to:  12 oz of beer.  5 oz of wine.  1 oz of hard liquor.  Do not drink on an empty stomach.  Keep yourself hydrated. Have water, diet soda, or unsweetened iced tea.  Regular soda, juice,  and other mixers might contain a lot of carbohydrates and should be counted. WHAT FOODS ARE NOT RECOMMENDED? As you make food choices, it is important to remember that all foods are not the same. Some foods have fewer nutrients per serving than other foods, even though they might have the same number of calories or carbohydrates. It is difficult to get your body what it needs when you eat foods with fewer nutrients. Examples of foods that you should avoid that are high in calories and carbohydrates but low in nutrients include:  Trans fats (most processed foods list trans fats on the Nutrition Facts label).  Regular soda.  Juice.  Candy.  Sweets, such as cake, pie, doughnuts, and cookies.  Fried foods. WHAT FOODS CAN I EAT? Have nutrient-rich foods, which will nourish your body and keep you healthy. The food you should eat also will depend on several factors, including:  The calories you need.  The medicines you take.  Your weight.  Your blood glucose level.  Your blood pressure level.  Your cholesterol level. You also  should eat a variety of foods, including:  Protein, such as meat, poultry, fish, tofu, nuts, and seeds (lean animal proteins are best).  Fruits.  Vegetables.  Dairy products, such as milk, cheese, and yogurt (low fat is best).  Breads, grains, pasta, cereal, rice, and beans.  Fats such as olive oil, trans fat-free margarine, canola oil, avocado, and olives. DOES EVERYONE WITH DIABETES MELLITUS HAVE THE SAME MEAL PLAN? Because every person with diabetes mellitus is different, there is not one meal plan that works for everyone. It is very important that you meet with a dietitian who will help you create a meal plan that is just right for you. Document Released: 04/12/2005 Document Revised: 07/21/2013 Document Reviewed: 06/12/2013 [facility] Patient Information 2015 [place], [place]. This information is not intended to replace advice given to you by your health care provider. Make sure you discuss any questions you have with your health care provider.

## 2014-11-24 NOTE — Progress Notes (Signed)
Subjective:    Patient ID: Phillip Barrett, male    DOB: [birth year], 39 y.o.   MRN: [id number]  Diabetes He presents for his follow-up diabetic visit. He has type 2 diabetes mellitus. His disease course has been fluctuating. Pertinent negatives for hypoglycemia include no confusion or dizziness. Pertinent negatives for diabetes include no blurred vision, no foot paresthesias, no foot ulcerations and no visual change. Symptoms are worsening. Pertinent negatives for diabetic complications include no nephropathy or peripheral neuropathy. Risk factors for coronary artery disease include diabetes mellitus, male sex, sedentary lifestyle, tobacco exposure and dyslipidemia. Current diabetic treatment includes oral agent (monotherapy). He is compliant with treatment all of the time. He is following a generally unhealthy diet. He participates in exercise daily. (Pt does not checked blood sugars ) An ACE inhibitor/angiotensin II receptor blocker is being taken (Pt states he has not taken this in awhile bc he could not afford it). He sees a podiatrist (One week ago).Eye exam is not current.  Hyperlipidemia This is a chronic problem. The current episode started more than 1 year ago. The problem is uncontrolled. Recent lipid tests were reviewed and are high. Exacerbating diseases include diabetes. He has no history of hypothyroidism. Pertinent negatives include no leg pain or myalgias. Current antihyperlipidemic treatment includes diet change. The current treatment provides mild improvement of lipids. Risk factors for coronary artery disease include diabetes mellitus, dyslipidemia, hypertension, male sex and obesity.      Review of Systems  Constitutional: Negative.   HENT: Negative.   Eyes: Negative for blurred vision.  Respiratory: Negative.   Cardiovascular: Negative.   Gastrointestinal: Negative.   Endocrine: Negative.   Genitourinary: Negative.   Musculoskeletal: Negative.  Negative for myalgias.    Neurological: Negative.  Negative for dizziness.  Hematological: Negative.   Psychiatric/Behavioral: Negative.  Negative for confusion.  All other systems reviewed and are negative.      Objective:   Physical Exam  Constitutional: He is oriented to person, place, and time. He appears well-developed and well-nourished. No distress.  HENT:  Head: Normocephalic.  Right Ear: External ear normal.  Left Ear: External ear normal.  Nose: Nose normal.  Mouth/Throat: Oropharynx is clear and moist.  Eyes: Pupils are equal, round, and reactive to light. Right eye exhibits no discharge. Left eye exhibits no discharge.  Neck: Normal range of motion. Neck supple. No thyromegaly present.  Cardiovascular: Normal rate, regular rhythm, normal heart sounds and intact distal pulses.   No murmur heard. Pulmonary/Chest: Effort normal and breath sounds normal. No respiratory distress. He has no wheezes.  Abdominal: Soft. Bowel sounds are normal. He exhibits no distension. There is no tenderness.  Musculoskeletal: Normal range of motion. He exhibits no edema or tenderness.  Neurological: He is alert and oriented to person, place, and time. He has normal reflexes. No cranial nerve deficit.  Skin: Skin is warm and dry. No rash noted. No erythema.  Psychiatric: He has a normal mood and affect. His behavior is normal. Judgment and thought content normal.  Vitals reviewed.     BP 112/67 mmHg  Pulse 71  Temp(Src) 97.2 F (36.2 C) (Oral)  Ht 5' 7"  (1.702 m)  Wt 253 lb (114.76 kg)  BMI 39.62 kg/m2     Assessment & Plan:  1. Type 2 diabetes mellitus not at goal - POCT glycosylated hemoglobin (Hb A1C) - CMP14+EGFR - POCT UA - Microalbumin  2. Hyperlipidemia - CMP14+EGFR - Lipid panel  3. Vitamin D deficiency - CMP14+EGFR  4. Type  2 diabetes mellitus with hyperglycemia - metFORMIN (GLUCOPHAGE) 500 MG tablet; Take 1 tablet (500 mg total) by mouth 2 (two) times daily with a meal.  Dispense: 180  tablet; Refill: 3  5. Midline low back pain without sciatica -No other NSAID's  meloxicam (MOBIC) 15 MG tablet; Take 1 tablet (15 mg total) by mouth daily.  Dispense: 30 tablet; Refill: 0   Continue all meds Labs pending Health Maintenance reviewed Diet and exercise encouraged RTO 3 months  Evelina Dun, FNP

## 2014-11-25 LAB — CMP14+EGFR
ALK PHOS: 47 IU/L (ref 39–117)
ALT: 13 IU/L (ref 0–44)
AST: 15 IU/L (ref 0–40)
Albumin/Globulin Ratio: 1.4 (ref 1.1–2.5)
Albumin: 4.2 g/dL (ref 3.5–5.5)
BILIRUBIN TOTAL: 0.3 mg/dL (ref 0.0–1.2)
BUN / CREAT RATIO: 7 — AB (ref 8–19)
BUN: 6 mg/dL (ref 6–20)
CALCIUM: 9.1 mg/dL (ref 8.7–10.2)
CO2: 24 mmol/L (ref 18–29)
CREATININE: 0.9 mg/dL (ref 0.76–1.27)
Chloride: 103 mmol/L (ref 97–108)
GFR calc Af Amer: 124 mL/min/{1.73_m2} (ref >59)
GFR calc non Af Amer: 107 mL/min/{1.73_m2} (ref >59)
GLOBULIN, TOTAL: 2.9 g/dL (ref 1.5–4.5)
Glucose: 83 mg/dL (ref 65–99)
Potassium: 4.7 mmol/L (ref 3.5–5.2)
Sodium: 139 mmol/L (ref 134–144)
TOTAL PROTEIN: 7.1 g/dL (ref 6.0–8.5)

## 2014-11-25 LAB — LIPID PANEL
CHOL/HDL RATIO: 3.1 ratio (ref 0.0–5.0)
Cholesterol, Total: 139 mg/dL (ref 100–199)
HDL: 45 mg/dL (ref >39)
LDL Calculated: 83 mg/dL (ref 0–99)
TRIGLYCERIDES: 53 mg/dL (ref 0–149)
VLDL CHOLESTEROL CAL: 11 mg/dL (ref 5–40)

## 2014-11-26 ENCOUNTER — Telehealth: Payer: Self-pay | Admitting: *Deleted

## 2014-11-26 NOTE — Telephone Encounter (Signed)
Left message, call for results.

## 2014-11-26 NOTE — Telephone Encounter (Signed)
-----   Message from Sharion Balloon, [place] sent at 11/26/2014 10:41 AM EDT ----- Microabuminuria negative HgbA1C WNL Kidney and liver function stable Cholesterol levels WNL

## 2014-12-11 DIAGNOSIS — X58XXXA Exposure to other specified factors, initial encounter: Secondary | ICD-10-CM | POA: Diagnosis not present

## 2014-12-11 DIAGNOSIS — S51812A Laceration without foreign body of left forearm, initial encounter: Secondary | ICD-10-CM | POA: Diagnosis not present

## 2014-12-11 DIAGNOSIS — F17200 Nicotine dependence, unspecified, uncomplicated: Secondary | ICD-10-CM | POA: Diagnosis not present

## 2014-12-11 DIAGNOSIS — Z23 Encounter for immunization: Secondary | ICD-10-CM | POA: Diagnosis not present

## 2014-12-11 DIAGNOSIS — E119 Type 2 diabetes mellitus without complications: Secondary | ICD-10-CM | POA: Diagnosis not present

## 2014-12-11 DIAGNOSIS — S61213A Laceration without foreign body of left middle finger without damage to nail, initial encounter: Secondary | ICD-10-CM | POA: Diagnosis not present

## 2014-12-11 DIAGNOSIS — S61412A Laceration without foreign body of left hand, initial encounter: Secondary | ICD-10-CM | POA: Diagnosis not present

## 2014-12-11 DIAGNOSIS — S61212A Laceration without foreign body of right middle finger without damage to nail, initial encounter: Secondary | ICD-10-CM | POA: Diagnosis not present

## 2014-12-11 DIAGNOSIS — Z79899 Other long term (current) drug therapy: Secondary | ICD-10-CM | POA: Diagnosis not present

## 2014-12-11 DIAGNOSIS — S61209A Unspecified open wound of unspecified finger without damage to nail, initial encounter: Secondary | ICD-10-CM | POA: Diagnosis not present

## 2014-12-13 ENCOUNTER — Telehealth: Payer: Self-pay | Admitting: Family
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
## 2014-12-21 ENCOUNTER — Ambulatory Visit: Payer: Medicare Other | Admitting: Family

## 2014-12-22 ENCOUNTER — Ambulatory Visit (INDEPENDENT_AMBULATORY_CARE_PROVIDER_SITE_OTHER): Payer: Medicare Other | Admitting: Family

## 2014-12-22 ENCOUNTER — Encounter: Payer: Self-pay | Admitting: Family

## 2014-12-22 VITALS — BP 118/77 | HR 65 | Temp 98.0°F | Ht 67.0 in | Wt 264.0 lb

## 2014-12-22 DIAGNOSIS — Z4802 Encounter for removal of sutures: Secondary | ICD-10-CM | POA: Diagnosis not present

## 2014-12-22 NOTE — Progress Notes (Signed)
   Subjective:    Patient ID: Phillip Barrett, male    DOB: Nov 02, 1975, 39 y.o.   MRN: [id number]  HPI Pt presents to the office today to remove 11 staples from left forearm and 1 sutures from right middle finger. Pt states he had two sutures in his finger, but "fell out".  Pt states he had the staples and sutures placed on 12/11/14. Pt denies any fever, redness, drainage, or warmth.    Review of Systems  Constitutional: Negative.   HENT: Negative.   Respiratory: Negative.   Cardiovascular: Negative.   Gastrointestinal: Negative.   Endocrine: Negative.   Genitourinary: Negative.   Musculoskeletal: Negative.   Neurological: Negative.   Hematological: Negative.   Psychiatric/Behavioral: Negative.   All other systems reviewed and are negative.      Objective:   Physical Exam  Constitutional: He is oriented to person, place, and time. He appears well-developed and well-nourished. No distress.  HENT:  Head: Normocephalic.  Eyes: Pupils are equal, round, and reactive to light. Right eye exhibits no discharge. Left eye exhibits no discharge.  Neck: Normal range of motion. Neck supple. No thyromegaly present.  Cardiovascular: Normal rate, regular rhythm, normal heart sounds and intact distal pulses.   No murmur heard. Pulmonary/Chest: Effort normal and breath sounds normal. No respiratory distress. He has no wheezes.  Abdominal: Soft. Bowel sounds are normal. He exhibits no distension. There is no tenderness.  Musculoskeletal: Normal range of motion. He exhibits no edema or tenderness.  Neurological: He is alert and oriented to person, place, and time. He has normal reflexes. No cranial nerve deficit.  Skin: Skin is warm and dry. No rash noted. No erythema.  Psychiatric: He has a normal mood and affect. His behavior is normal. Judgment and thought content normal.  Vitals reviewed.   BP 118/77 mmHg  Pulse 65  Temp(Src) 98 F (36.7 C) (Oral)  Ht 5\' 7"  (1.702 m)  Wt 264 lb (119.75 kg)   BMI 41.34 kg/m2  Procedure Note Right middle finger cleaned and one suture removed. Antibiotic ointment applied with a Band-Aid  Left forearm cleaned, and 11 staples removed and 3 sutures removed. Antibiotic ointment applied and Band-Aid.      Assessment & Plan:  1. Visit for suture removal  2. Encounter for staple removal  S/S of infection discussed Report any redness, swelling, drainage, or warmth Keep clean and dry Several packets of antibiotic ointment given to pt RTO prn  Evelina Dun, FNP

## 2014-12-22 NOTE — Patient Instructions (Signed)

## 2015-01-26 DIAGNOSIS — L110 Acquired keratosis follicularis: Secondary | ICD-10-CM | POA: Diagnosis not present

## 2015-01-26 DIAGNOSIS — E1142 Type 2 diabetes mellitus with diabetic polyneuropathy: Secondary | ICD-10-CM | POA: Diagnosis not present

## 2015-01-26 DIAGNOSIS — B351 Tinea unguium: Secondary | ICD-10-CM | POA: Diagnosis not present

## 2015-02-24 ENCOUNTER — Ambulatory Visit: Payer: Medicare Other | Admitting: Family

## 2015-04-08 ENCOUNTER — Ambulatory Visit: Payer: Medicare Other | Admitting: Family

## 2015-04-13 DIAGNOSIS — B351 Tinea unguium: Secondary | ICD-10-CM | POA: Diagnosis not present

## 2015-04-13 DIAGNOSIS — L110 Acquired keratosis follicularis: Secondary | ICD-10-CM | POA: Diagnosis not present

## 2015-04-13 DIAGNOSIS — E1142 Type 2 diabetes mellitus with diabetic polyneuropathy: Secondary | ICD-10-CM | POA: Diagnosis not present

## 2015-04-19 ENCOUNTER — Ambulatory Visit (INDEPENDENT_AMBULATORY_CARE_PROVIDER_SITE_OTHER): Payer: Medicare Other | Admitting: Family

## 2015-04-19 ENCOUNTER — Encounter: Payer: Self-pay | Admitting: Family

## 2015-04-19 VITALS — BP 126/80 | HR 67 | Temp 97.2°F | Ht 67.0 in | Wt 259.8 lb

## 2015-04-19 DIAGNOSIS — E785 Hyperlipidemia, unspecified: Secondary | ICD-10-CM | POA: Diagnosis not present

## 2015-04-19 DIAGNOSIS — E119 Type 2 diabetes mellitus without complications: Secondary | ICD-10-CM

## 2015-04-19 DIAGNOSIS — E559 Vitamin D deficiency, unspecified: Secondary | ICD-10-CM | POA: Diagnosis not present

## 2015-04-19 DIAGNOSIS — E669 Obesity, unspecified: Secondary | ICD-10-CM | POA: Diagnosis not present

## 2015-04-19 LAB — POCT GLYCOSYLATED HEMOGLOBIN (HGB A1C): HEMOGLOBIN A1C: 7.7

## 2015-04-19 LAB — POCT UA - MICROALBUMIN: Microalbumin Ur, POC: NEGATIVE mg/L

## 2015-04-19 MED ORDER — SIMVASTATIN 20 MG PO TABS: 20.0000 mg | ORAL_TABLET | Freq: Every day | ORAL | Status: DC

## 2015-04-19 MED ORDER — ASPIRIN EC 81 MG PO TBEC: 81.0000 mg | DELAYED_RELEASE_TABLET | Freq: Every day | ORAL | Status: DC

## 2015-04-19 NOTE — Progress Notes (Signed)
Subjective:    Patient ID: Phillip Barrett, male    DOB: [birth year], 39 y.o.   MRN: [id number]  Pt presents to the office today for chronic follow up. Since pt's last visit, patient was incarcerate for 47 days. Pt states his blood sugars were controlled while in jail.  Diabetes He presents for his follow-up diabetic visit. He has type 2 diabetes mellitus. His disease course has been fluctuating. Pertinent negatives for hypoglycemia include no confusion or dizziness. Pertinent negatives for diabetes include no blurred vision, no foot paresthesias, no foot ulcerations and no visual change. Symptoms are worsening. Pertinent negatives for diabetic complications include no nephropathy or peripheral neuropathy. Risk factors for coronary artery disease include diabetes mellitus, male sex, sedentary lifestyle, tobacco exposure and dyslipidemia. Current diabetic treatment includes oral agent (monotherapy). He is compliant with treatment all of the time. He is following a generally unhealthy diet. He participates in exercise daily. (Pt does not checked blood sugars ) An ACE inhibitor/angiotensin II receptor blocker is not being taken. He sees a podiatrist.Eye exam is not current.  Hyperlipidemia This is a chronic problem. The current episode started more than 1 year ago. The problem is controlled. Recent lipid tests were reviewed and are normal. Exacerbating diseases include diabetes. He has no history of hypothyroidism. Factors aggravating his hyperlipidemia include smoking. Pertinent negatives include no leg pain or myalgias. Current antihyperlipidemic treatment includes diet change and statins. The current treatment provides mild improvement of lipids. Risk factors for coronary artery disease include diabetes mellitus, dyslipidemia, hypertension, male sex and obesity.      Review of Systems  Constitutional: Negative.   HENT: Negative.   Eyes: Negative for blurred vision.  Respiratory: Negative.     Cardiovascular: Negative.   Gastrointestinal: Negative.   Endocrine: Negative.   Genitourinary: Negative.   Musculoskeletal: Negative.  Negative for myalgias.  Neurological: Negative.  Negative for dizziness.  Hematological: Negative.   Psychiatric/Behavioral: Negative.  Negative for confusion.  All other systems reviewed and are negative.      Objective:   Physical Exam  Constitutional: He is oriented to person, place, and time. He appears well-developed and well-nourished. No distress.  HENT:  Head: Normocephalic.  Right Ear: External ear normal.  Left Ear: External ear normal.  Nose: Nose normal.  Mouth/Throat: Oropharynx is clear and moist.  Eyes: Pupils are equal, round, and reactive to light. Right eye exhibits no discharge. Left eye exhibits no discharge.  Neck: Normal range of motion. Neck supple. No thyromegaly present.  Cardiovascular: Normal rate, regular rhythm, normal heart sounds and intact distal pulses.   No murmur heard. Pulmonary/Chest: Effort normal and breath sounds normal. No respiratory distress. He has no wheezes.  Abdominal: Soft. Bowel sounds are normal. He exhibits no distension. There is no tenderness.  Musculoskeletal: Normal range of motion. He exhibits no edema or tenderness.  Neurological: He is alert and oriented to person, place, and time. He has normal reflexes. No cranial nerve deficit.  Skin: Skin is warm and dry. No rash noted. No erythema.  Psychiatric: He has a normal mood and affect. His behavior is normal. Judgment and thought content normal.  Vitals reviewed.   BP 126/80 mmHg  Pulse 67  Temp(Src) 97.2 F (36.2 C) (Oral)  Ht _0  (1.702 m)  Wt 259 lb 12.8 oz (117.845 kg)  BMI 40.68 kg/m2       Assessment & Plan:  1. Type 2 diabetes mellitus not at goal - POCT glycosylated hemoglobin (Hb A1C) -  POCT UA - Microalbumin - CMP14+EGFR - Ambulatory referral to Ophthalmology - aspirin EC 81 MG tablet; Take 1 tablet (81 mg total)  by mouth daily.  Dispense: 90 tablet; Refill: 1  2. Hyperlipidemia -PT states he has not been taking his Zocor- RX refilled - CMP14+EGFR - Lipid panel - aspirin EC 81 MG tablet; Take 1 tablet (81 mg total) by mouth daily.  Dispense: 90 tablet; Refill: 1 - simvastatin (ZOCOR) 20 MG tablet; Take 1 tablet (20 mg total) by mouth at bedtime.  Dispense: 90 tablet; Refill: 3  3. Obesity (BMI 30-39.9) - CMP14+EGFR  4. Vitamin D deficiency - CMP14+EGFR   Continue all meds Labs pending Health Maintenance reviewed Diet and exercise encouraged RTO 3 months  Evelina Dun, FNP

## 2015-04-19 NOTE — Patient Instructions (Signed)

## 2015-04-20 LAB — CMP14+EGFR
A/G RATIO: 1.4 (ref 1.1–2.5)
ALBUMIN: 4.1 g/dL (ref 3.5–5.5)
ALK PHOS: 73 IU/L (ref 39–117)
ALT: 15 IU/L (ref 0–44)
AST: 15 IU/L (ref 0–40)
BUN/Creatinine Ratio: 10 (ref 8–19)
BUN: 9 mg/dL (ref 6–20)
CO2: 24 mmol/L (ref 18–29)
Calcium: 9.4 mg/dL (ref 8.7–10.2)
Chloride: 100 mmol/L (ref 97–108)
Creatinine, Ser: 0.93 mg/dL (ref 0.76–1.27)
GFR, EST AFRICAN AMERICAN: 119 mL/min/{1.73_m2} (ref >59)
GFR, EST NON AFRICAN AMERICAN: 103 mL/min/{1.73_m2} (ref >59)
GLUCOSE: 185 mg/dL — AB (ref 65–99)
Globulin, Total: 2.9 g/dL (ref 1.5–4.5)
Potassium: 4.9 mmol/L (ref 3.5–5.2)
SODIUM: 139 mmol/L (ref 134–144)
Total Protein: 7 g/dL (ref 6.0–8.5)

## 2015-04-20 LAB — LIPID PANEL
CHOL/HDL RATIO: 3.5 ratio (ref 0.0–5.0)
Cholesterol, Total: 152 mg/dL (ref 100–199)
HDL: 43 mg/dL (ref >39)
LDL CALC: 85 mg/dL (ref 0–99)
Triglycerides: 120 mg/dL (ref 0–149)
VLDL Cholesterol Cal: 24 mg/dL (ref 5–40)

## 2015-04-20 NOTE — Progress Notes (Signed)
[email or web address]

## 2015-06-22 DIAGNOSIS — E1142 Type 2 diabetes mellitus with diabetic polyneuropathy: Secondary | ICD-10-CM | POA: Diagnosis not present

## 2015-06-22 DIAGNOSIS — L110 Acquired keratosis follicularis: Secondary | ICD-10-CM | POA: Diagnosis not present

## 2015-06-22 DIAGNOSIS — B351 Tinea unguium: Secondary | ICD-10-CM | POA: Diagnosis not present

## 2015-07-12 ENCOUNTER — Telehealth: Payer: Self-pay | Admitting: Family

## 2015-07-12 NOTE — Telephone Encounter (Signed)
Per Alyse Low she does not see any reason why he should reschedule his appointment to January. I called the patient and left a message that Alyse Low wants him to keep his appointment next week in December with her.

## 2015-07-19 ENCOUNTER — Telehealth: Payer: Self-pay | Admitting: Family

## 2015-07-20 ENCOUNTER — Ambulatory Visit: Payer: Self-pay | Admitting: Family

## 2015-07-28 ENCOUNTER — Encounter: Payer: Self-pay | Admitting: Family

## 2015-07-28 ENCOUNTER — Ambulatory Visit (INDEPENDENT_AMBULATORY_CARE_PROVIDER_SITE_OTHER): Payer: Medicare Other | Admitting: Family

## 2015-07-28 ENCOUNTER — Telehealth: Payer: Self-pay

## 2015-07-28 VITALS — BP 140/87 | HR 66 | Temp 97.0°F | Ht 67.0 in | Wt 243.2 lb

## 2015-07-28 DIAGNOSIS — E785 Hyperlipidemia, unspecified: Secondary | ICD-10-CM

## 2015-07-28 DIAGNOSIS — Z202 Contact with and (suspected) exposure to infections with a predominantly sexual mode of transmission: Secondary | ICD-10-CM

## 2015-07-28 DIAGNOSIS — Z23 Encounter for immunization: Secondary | ICD-10-CM | POA: Diagnosis not present

## 2015-07-28 DIAGNOSIS — E559 Vitamin D deficiency, unspecified: Secondary | ICD-10-CM

## 2015-07-28 DIAGNOSIS — Z114 Encounter for screening for human immunodeficiency virus [HIV]: Secondary | ICD-10-CM

## 2015-07-28 DIAGNOSIS — E119 Type 2 diabetes mellitus without complications: Secondary | ICD-10-CM | POA: Diagnosis not present

## 2015-07-28 DIAGNOSIS — E669 Obesity, unspecified: Secondary | ICD-10-CM | POA: Diagnosis not present

## 2015-07-28 LAB — POCT GLYCOSYLATED HEMOGLOBIN (HGB A1C): HEMOGLOBIN A1C: 6.5

## 2015-07-28 NOTE — Progress Notes (Signed)
Subjective:    Patient ID: Phillip Barrett, male    DOB: 1975-11-15, 39 y.o.   MRN: [id number]  Pt presents to the office today for chronic follow up.  Diabetes He presents for his follow-up diabetic visit. He has type 2 diabetes mellitus. His disease course has been fluctuating. Pertinent negatives for hypoglycemia include no confusion or dizziness. Pertinent negatives for diabetes include no blurred vision, no foot paresthesias, no foot ulcerations and no visual change. Symptoms are worsening. Pertinent negatives for diabetic complications include no nephropathy or peripheral neuropathy. Risk factors for coronary artery disease include diabetes mellitus, male sex, sedentary lifestyle, tobacco exposure and dyslipidemia. Current diabetic treatment includes oral agent (monotherapy). He is compliant with treatment most of the time. He is following a generally unhealthy diet. He participates in exercise daily. (Pt does not checked blood sugars ) An ACE inhibitor/angiotensin II receptor blocker is not being taken. He sees a podiatrist.Eye exam is not current.  Hyperlipidemia This is a chronic problem. The current episode started more than 1 year ago. The problem is controlled. Recent lipid tests were reviewed and are normal. Exacerbating diseases include diabetes. He has no history of hypothyroidism. Factors aggravating his hyperlipidemia include smoking. Pertinent negatives include no leg pain or myalgias. Current antihyperlipidemic treatment includes diet change and statins. The current treatment provides mild improvement of lipids. Risk factors for coronary artery disease include diabetes mellitus, dyslipidemia, hypertension, male sex and obesity.      Review of Systems  Constitutional: Negative.   HENT: Negative.   Eyes: Negative for blurred vision.  Respiratory: Negative.   Cardiovascular: Negative.   Gastrointestinal: Negative.   Endocrine: Negative.   Genitourinary: Negative.     Musculoskeletal: Negative.  Negative for myalgias.  Neurological: Negative.  Negative for dizziness.  Hematological: Negative.   Psychiatric/Behavioral: Negative.  Negative for confusion.  All other systems reviewed and are negative.      Objective:   Physical Exam  Constitutional: He is oriented to person, place, and time. He appears well-developed and well-nourished. No distress.  HENT:  Head: Normocephalic.  Right Ear: External ear normal.  Left Ear: External ear normal.  Nose: Nose normal.  Mouth/Throat: Oropharynx is clear and moist.  Eyes: Pupils are equal, round, and reactive to light. Right eye exhibits no discharge. Left eye exhibits no discharge.  Neck: Normal range of motion. Neck supple. No thyromegaly present.  Cardiovascular: Normal rate, regular rhythm, normal heart sounds and intact distal pulses.   No murmur heard. Pulmonary/Chest: Effort normal and breath sounds normal. No respiratory distress. He has no wheezes.  Abdominal: Soft. Bowel sounds are normal. He exhibits no distension. There is no tenderness.  Musculoskeletal: Normal range of motion. He exhibits no edema or tenderness.  Neurological: He is alert and oriented to person, place, and time. He has normal reflexes. No cranial nerve deficit.  Skin: Skin is warm and dry. No rash noted. No erythema.  Psychiatric: He has a normal mood and affect. His behavior is normal. Judgment and thought content normal.  Vitals reviewed.     BP 140/87 mmHg  Pulse 66  Temp(Src) 97 F (36.1 C) (Oral)  Ht _0  (1.702 m)  Wt 243 lb 3.2 oz (110.315 kg)  BMI 38.08 kg/m2\     Assessment & Plan:  1. Type 2 diabetes mellitus not at goal [facility]) - POCT glycosylated hemoglobin (Hb A1C) - CMP14+EGFR - Microalbumin / creatinine urine ratio - Ambulatory referral to Ophthalmology  2. Vitamin D deficiency - CMP14+EGFR  3. Obesity (BMI 30-39.9) - CMP14+EGFR  4. Hyperlipidemia - CMP14+EGFR - Lipid panel  5. Possible  exposure to STD - CMP14+EGFR - HIV antibody - STD Screen (8)  6. Screening for HIV (human immunodeficiency virus) - HIV antibody   Continue all meds Labs pending Health Maintenance reviewed Diet and exercise encouraged RTO 3 months  Evelina Dun, FNP

## 2015-07-28 NOTE — Telephone Encounter (Signed)
LMOM enquiring about what eye doctor he would like to be scheduled with

## 2015-07-28 NOTE — Patient Instructions (Signed)
Type 2 Diabetes Mellitus, Adult Type 2 diabetes mellitus, often simply referred to as type 2 diabetes, is a long-lasting (chronic) disease. In type 2 diabetes, the pancreas does not make enough insulin (a hormone), the cells are less responsive to the insulin that is made (insulin resistance), or both. Normally, insulin moves sugars from food into the tissue cells. The tissue cells use the sugars for energy. The lack of insulin or the lack of normal response to insulin causes excess sugars to build up in the blood instead of going into the tissue cells. As a result, high blood sugar (hyperglycemia) develops. The effect of high sugar (glucose) levels can cause many complications. Type 2 diabetes was also previously called adult-onset diabetes, but it can occur at any age.  RISK FACTORS  A person is predisposed to developing type 2 diabetes if someone in the family has the disease and also has one or more of the following primary risk factors:  Weight gain, or being overweight or obese.  An inactive lifestyle.  A history of consistently eating high-calorie foods. Maintaining a normal weight and regular physical activity can reduce the chance of developing type 2 diabetes. SYMPTOMS  A person with type 2 diabetes may not show symptoms initially. The symptoms of type 2 diabetes appear slowly. The symptoms include:  Increased thirst (polydipsia).  Increased urination (polyuria).  Increased urination during the night (nocturia).  Sudden or unexplained weight changes.  Frequent, recurring infections.  Tiredness (fatigue).  Weakness.  Vision changes, such as blurred vision.  Fruity smell to your breath.  Abdominal pain.  Nausea or vomiting.  Cuts or bruises which are slow to heal.  Tingling or numbness in the hands or feet.  An open skin wound (ulcer). DIAGNOSIS Type 2 diabetes is frequently not diagnosed until complications of diabetes are present. Type 2 diabetes is diagnosed  when symptoms or complications are present and when blood glucose levels are increased. Your blood glucose level may be checked by one or more of the following blood tests:  A fasting blood glucose test. You will not be allowed to eat for at least 8 hours before a blood sample is taken.  A random blood glucose test. Your blood glucose is checked at any time of the day regardless of when you ate.  A hemoglobin A1c blood glucose test. A hemoglobin A1c test provides information about blood glucose control over the previous 3 months.  An oral glucose tolerance test (OGTT). Your blood glucose is measured after you have not eaten (fasted) for 2 hours and then after you drink a glucose-containing beverage. TREATMENT   You may need to take insulin or diabetes medicine daily to keep blood glucose levels in the desired range.  If you use insulin, you may need to adjust the dosage depending on the carbohydrates that you eat with each meal or snack.  Lifestyle changes are recommended as part of your treatment. These may include:  Following an individualized diet plan developed by a nutritionist or dietitian.  Exercising daily. Your health care providers will set individualized treatment goals for you based on your age, your medicines, how long you have had diabetes, and any other medical conditions you have. Generally, the goal of treatment is to maintain the following blood glucose levels:  Before meals (preprandial): 80-130 mg/dL.  After meals (postprandial): below 180 mg/dL.  A1c: less than 6.5-7%. HOME CARE INSTRUCTIONS   Have your hemoglobin A1c level checked twice a year.  Perform daily blood glucose monitoring  as directed by your health care provider.  Monitor urine ketones when you are ill and as directed by your health care provider.  Take your diabetes medicine or insulin as directed by your health care provider to maintain your blood glucose levels in the desired range.  Never run  out of diabetes medicine or insulin. It is needed every day.  If you are using insulin, you may need to adjust the amount of insulin given based on your intake of carbohydrates. Carbohydrates can raise blood glucose levels but need to be included in your diet. Carbohydrates provide vitamins, minerals, and fiber which are an essential part of a healthy diet. Carbohydrates are found in fruits, vegetables, whole grains, dairy products, legumes, and foods containing added sugars.  Eat healthy foods. You should make an appointment to see a registered dietitian to help you create an eating plan that is right for you.  Lose weight if you are overweight.  Carry a medical alert card or wear your medical alert jewelry.  Carry a 15-gram carbohydrate snack with you at all times to treat low blood glucose (hypoglycemia). Some examples of 15-gram carbohydrate snacks include:  Glucose tablets, 3 or 4.  Glucose gel, 15-gram tube.  Raisins, 2 tablespoons (24 grams).  Jelly beans, 6.  Animal crackers, 8.  Regular pop, 4 ounces (120 mL).  Gummy treats, 9.  Recognize hypoglycemia. Hypoglycemia occurs with blood glucose levels of 70 mg/dL and below. The risk for hypoglycemia increases when fasting or skipping meals, during or after intense exercise, and during sleep. Hypoglycemia symptoms can include:  Tremors or shakes.  Decreased ability to concentrate.  Sweating.  Increased heart rate.  Headache.  Dry mouth.  Hunger.  Irritability.  Anxiety.  Restless sleep.  Altered speech or coordination.  Confusion.  Treat hypoglycemia promptly. If you are alert and able to safely swallow, follow the 15:15 rule:  Take 15-20 grams of rapid-acting glucose or carbohydrate. Rapid-acting options include glucose gel, glucose tablets, or 4 ounces (120 mL) of fruit juice, regular soda, or low-fat milk.  Check your blood glucose level 15 minutes after taking the glucose.  Take 15-20 grams more of  glucose if the repeat blood glucose level is still 70 mg/dL or below.  Eat a meal or snack within 1 hour once blood glucose levels return to normal.  Be alert to feeling very thirsty and urinating more frequently than usual, which are early signs of hyperglycemia. An early awareness of hyperglycemia allows for prompt treatment. Treat hyperglycemia as directed by your health care provider.  Engage in at least 150 minutes of moderate-intensity physical activity a week, spread over at least 3 days of the week or as directed by your health care provider. In addition, you should engage in resistance exercise at least 2 times a week or as directed by your health care provider. Try to spend no more than 90 minutes at one time inactive.  Adjust your medicine and food intake as needed if you start a new exercise or sport.  Follow your sick-day plan anytime you are unable to eat or drink as usual.  Do not use any tobacco products including cigarettes, chewing tobacco, or electronic cigarettes. If you need help quitting, ask your health care provider.  Limit alcohol intake to no more than 1 drink per day for nonpregnant women and 2 drinks per day for men. You should drink alcohol only when you are also eating food. Talk with your health care provider whether alcohol is safe   for you. Tell your health care provider if you drink alcohol several times a week.  Keep all follow-up visits as directed by your health care provider. This is important.  Schedule an eye exam soon after the diagnosis of type 2 diabetes and then annually.  Perform daily skin and foot care. Examine your skin and feet daily for cuts, bruises, redness, nail problems, bleeding, blisters, or sores. A foot exam by a health care provider should be done annually.  Brush your teeth and gums at least twice a day and floss at least once a day. Follow up with your dentist regularly.  Share your diabetes management plan with your workplace or  school.  Keep your immunizations up to date. It is recommended that you receive a flu (influenza) vaccine every year. It is also recommended that you receive a pneumonia (pneumococcal) vaccine. If you are 65 years of age or older and have never received a pneumonia vaccine, this vaccine may be given as a series of two separate shots. Ask your health care provider which additional vaccines may be recommended.  Learn to manage stress.  Obtain ongoing diabetes education and support as needed.  Participate in or seek rehabilitation as needed to maintain or improve independence and quality of life. Request a physical or occupational therapy referral if you are having foot or hand numbness, or difficulties with grooming, dressing, eating, or physical activity. SEEK MEDICAL CARE IF:   You are unable to eat food or drink fluids for more than 6 hours.  You have nausea and vomiting for more than 6 hours.  Your blood glucose level is over 240 mg/dL.  There is a change in mental status.  You develop an additional serious illness.  You have diarrhea for more than 6 hours.  You have been sick or have had a fever for a couple of days and are not getting better.  You have pain during any physical activity.  SEEK IMMEDIATE MEDICAL CARE IF:  You have difficulty breathing.  You have moderate to large ketone levels.   This information is not intended to replace advice given to you by your health care provider. Make sure you discuss any questions you have with your health care provider.   Document Released: 07/16/2005 Document Revised: 04/06/2015 Document Reviewed: 02/12/2012 Elsevier Interactive Patient Education 2016 [place] Maintenance, Male A healthy lifestyle and preventative care can promote health and wellness.  Maintain regular health, dental, and eye exams.  Eat a healthy diet. Foods like vegetables, fruits, whole grains, low-fat dairy products, and lean protein foods  contain the nutrients you need and are low in calories. Decrease your intake of foods high in solid fats, added sugars, and salt. Get information about a proper diet from your health care provider, if necessary.  Regular physical exercise is one of the most important things you can do for your health. Most adults should get at least 150 minutes of moderate-intensity exercise (any activity that increases your heart rate and causes you to sweat) each week. In addition, most adults need muscle-strengthening exercises on 2 or more days a week.   Maintain a healthy weight. The body mass index (BMI) is a screening tool to identify possible weight problems. It provides an estimate of body fat based on height and weight. Your health care provider can find your BMI and can help you achieve or maintain a healthy weight. For males 20 years and older:  A BMI below 18.5 is considered underweight.  A  BMI of 18.5 to 24.9 is normal.  A BMI of 25 to 29.9 is considered overweight.  A BMI of 30 and above is considered obese.  Maintain normal blood lipids and cholesterol by exercising and minimizing your intake of saturated fat. Eat a balanced diet with plenty of fruits and vegetables. Blood tests for lipids and cholesterol should begin at age 48 and be repeated every 5 years. If your lipid or cholesterol levels are high, you are over age 2, or you are at high risk for heart disease, you may need your cholesterol levels checked more frequently.Ongoing high lipid and cholesterol levels should be treated with medicines if diet and exercise are not working.  If you smoke, find out from your health care provider how to quit. If you do not use tobacco, do not start.  Lung cancer screening is recommended for adults aged 54-80 years who are at high risk for developing lung cancer because of a history of smoking. A yearly low-dose CT scan of the lungs is recommended for people who have at least a 30-pack-year history of  smoking and are current smokers or have quit within the past 15 years. A pack year of smoking is smoking an average of 1 pack of cigarettes a day for 1 year (for example, a 30-pack-year history of smoking could mean smoking 1 pack a day for 30 years or 2 packs a day for 15 years). Yearly screening should continue until the smoker has stopped smoking for at least 15 years. Yearly screening should be stopped for people who develop a health problem that would prevent them from having lung cancer treatment.  If you choose to drink alcohol, do not have more than 2 drinks per day. One drink is considered to be 12 oz (360 mL) of beer, 5 oz (150 mL) of wine, or 1.5 oz (45 mL) of liquor.  Avoid the use of street drugs. Do not share needles with anyone. Ask for help if you need support or instructions about stopping the use of drugs.  High blood pressure causes heart disease and increases the risk of stroke. High blood pressure is more likely to develop in:  People who have blood pressure in the end of the normal range (100-139/85-89 mm Hg).  People who are overweight or obese.  People who are African American.  If you are 57-75 years of age, have your blood pressure checked every 3-5 years. If you are 27 years of age or older, have your blood pressure checked every year. You should have your blood pressure measured twice--once when you are at a hospital or clinic, and once when you are not at a hospital or clinic. Record the average of the two measurements. To check your blood pressure when you are not at a hospital or clinic, you can use:  An automated blood pressure machine at a pharmacy.  A home blood pressure monitor.  If you are 51-5 years old, ask your health care provider if you should take aspirin to prevent heart disease.  Diabetes screening involves taking a blood sample to check your fasting blood sugar level. This should be done once every 3 years after age 31 if you are at a normal weight  and without risk factors for diabetes. Testing should be considered at a younger age or be carried out more frequently if you are overweight and have at least 1 risk factor for diabetes.  Colorectal cancer can be detected and often prevented. Most routine colorectal cancer  screening begins at the age of 57 and continues through age 75. However, your health care provider may recommend screening at an earlier age if you have risk factors for colon cancer. On a yearly basis, your health care provider may provide home test kits to check for hidden blood in the stool. A small camera at the end of a tube may be used to directly examine the colon (sigmoidoscopy or colonoscopy) to detect the earliest forms of colorectal cancer. Talk to your health care provider about this at age 9 when routine screening begins. A direct exam of the colon should be repeated every 5-10 years through age 3, unless early forms of precancerous polyps or small growths are found.  People who are at an increased risk for hepatitis B should be screened for this virus. You are considered at high risk for hepatitis B if:  You were born in a country where hepatitis B occurs often. Talk with your health care provider about which countries are considered high risk.  Your parents were born in a high-risk country and you have not received a shot to protect against hepatitis B (hepatitis B vaccine).  You have HIV or AIDS.  You use needles to inject street drugs.  You live with, or have sex with, someone who has hepatitis B.  You are a man who has sex with other men (MSM).  You get hemodialysis treatment.  You take certain medicines for conditions like cancer, organ transplantation, and autoimmune conditions.  Hepatitis C blood testing is recommended for all people born from [birth year] through 1965 and any individual with known risk factors for hepatitis C.  Healthy men should no longer receive prostate-specific antigen (PSA) blood tests  as part of routine cancer screening. Talk to your health care provider about prostate cancer screening.  Testicular cancer screening is not recommended for adolescents or adult males who have no symptoms. Screening includes self-exam, a health care provider exam, and other screening tests. Consult with your health care provider about any symptoms you have or any concerns you have about testicular cancer.  Practice safe sex. Use condoms and avoid high-risk sexual practices to reduce the spread of sexually transmitted infections (STIs).  You should be screened for STIs, including gonorrhea and chlamydia if:  You are sexually active and are younger than 24 years.  You are older than 24 years, and your health care provider tells you that you are at risk for this type of infection.  Your sexual activity has changed since you were last screened, and you are at an increased risk for chlamydia or gonorrhea. Ask your health care provider if you are at risk.  If you are at risk of being infected with HIV, it is recommended that you take a prescription medicine daily to prevent HIV infection. This is called pre-exposure prophylaxis (PrEP). You are considered at risk if:  You are a man who has sex with other men (MSM).  You are a heterosexual man who is sexually active with multiple partners.  You take drugs by injection.  You are sexually active with a partner who has HIV.  Talk with your health care provider about whether you are at high risk of being infected with HIV. If you choose to begin PrEP, you should first be tested for HIV. You should then be tested every 3 months for as long as you are taking PrEP.  Use sunscreen. Apply sunscreen liberally and repeatedly throughout the day. You should seek shade when your  shadow is shorter than you. Protect yourself by wearing long sleeves, pants, a wide-brimmed hat, and sunglasses year round whenever you are outdoors.  Tell your health care provider of  new moles or changes in moles, especially if there is a change in shape or color. Also, tell your health care provider if a mole is larger than the size of a pencil eraser.  A one-time screening for abdominal aortic aneurysm (AAA) and surgical repair of large AAAs by ultrasound is recommended for men aged 23-75 years who are current or former smokers.  Stay current with your vaccines (immunizations).   This information is not intended to replace advice given to you by your health care provider. Make sure you discuss any questions you have with your health care provider.   Document Released: 01/12/2008 Document Revised: 08/06/2014 Document Reviewed: 12/11/2010 Elsevier Interactive Patient Education [employer].

## 2015-07-29 LAB — CMP14+EGFR
A/G RATIO: 1.4 (ref 1.1–2.5)
ALBUMIN: 4.2 g/dL (ref 3.5–5.5)
ALK PHOS: 61 IU/L (ref 39–117)
ALT: 17 IU/L (ref 0–44)
AST: 24 IU/L (ref 0–40)
BUN/Creatinine Ratio: 8 (ref 8–19)
BUN: 7 mg/dL (ref 6–20)
Bilirubin Total: 0.4 mg/dL (ref 0.0–1.2)
CO2: 24 mmol/L (ref 18–29)
CREATININE: 0.92 mg/dL (ref 0.76–1.27)
Calcium: 9.6 mg/dL (ref 8.7–10.2)
Chloride: 101 mmol/L (ref 96–106)
GFR calc Af Amer: 121 mL/min/{1.73_m2} (ref >59)
GFR calc non Af Amer: 104 mL/min/{1.73_m2} (ref >59)
GLOBULIN, TOTAL: 3.1 g/dL (ref 1.5–4.5)
Glucose: 74 mg/dL (ref 65–99)
Potassium: 4.8 mmol/L (ref 3.5–5.2)
SODIUM: 138 mmol/L (ref 134–144)
Total Protein: 7.3 g/dL (ref 6.0–8.5)

## 2015-07-29 LAB — STD SCREEN (8)
HEP B C IGM: NEGATIVE
HIV SCREEN 4TH GENERATION: NONREACTIVE
Hep A IgM: NEGATIVE
Hepatitis B Surface Ag: NEGATIVE
RPR Ser Ql: NONREACTIVE

## 2015-07-29 LAB — LIPID PANEL
CHOLESTEROL TOTAL: 155 mg/dL (ref 100–199)
Chol/HDL Ratio: 2.7 ratio units (ref 0.0–5.0)
HDL: 57 mg/dL (ref >39)
LDL CALC: 84 mg/dL (ref 0–99)
TRIGLYCERIDES: 69 mg/dL (ref 0–149)
VLDL Cholesterol Cal: 14 mg/dL (ref 5–40)

## 2015-07-29 LAB — MICROALBUMIN / CREATININE URINE RATIO
Creatinine, Urine: 163.3 mg/dL
MICROALB/CREAT RATIO: 12.9 mg/g creat (ref 0.0–30.0)
MICROALBUM., U, RANDOM: 21 ug/mL

## 2015-08-03 NOTE — Telephone Encounter (Signed)
I contacted patient about his labwork and he wanted to let us know he prefer to see someone in [place] for his opthamology exam

## 2015-08-31 DIAGNOSIS — E1142 Type 2 diabetes mellitus with diabetic polyneuropathy: Secondary | ICD-10-CM | POA: Diagnosis not present

## 2015-08-31 DIAGNOSIS — L110 Acquired keratosis follicularis: Secondary | ICD-10-CM | POA: Diagnosis not present

## 2015-08-31 DIAGNOSIS — B351 Tinea unguium: Secondary | ICD-10-CM | POA: Diagnosis not present

## 2015-10-27 ENCOUNTER — Ambulatory Visit: Payer: Self-pay | Admitting: Family

## 2015-11-03 ENCOUNTER — Ambulatory Visit (INDEPENDENT_AMBULATORY_CARE_PROVIDER_SITE_OTHER): Payer: Medicare Other | Admitting: Family

## 2015-11-03 ENCOUNTER — Encounter (INDEPENDENT_AMBULATORY_CARE_PROVIDER_SITE_OTHER): Payer: Self-pay

## 2015-11-03 ENCOUNTER — Encounter: Payer: Self-pay | Admitting: Family

## 2015-11-03 VITALS — BP 126/87 | HR 72 | Temp 97.4°F | Ht 67.0 in | Wt 218.0 lb

## 2015-11-03 DIAGNOSIS — E559 Vitamin D deficiency, unspecified: Secondary | ICD-10-CM

## 2015-11-03 DIAGNOSIS — E119 Type 2 diabetes mellitus without complications: Secondary | ICD-10-CM | POA: Diagnosis not present

## 2015-11-03 DIAGNOSIS — E669 Obesity, unspecified: Secondary | ICD-10-CM | POA: Diagnosis not present

## 2015-11-03 DIAGNOSIS — E785 Hyperlipidemia, unspecified: Secondary | ICD-10-CM

## 2015-11-03 LAB — BAYER DCA HB A1C WAIVED: HB A1C (BAYER DCA - WAIVED): 5.3 % (ref <7.0)

## 2015-11-03 MED ORDER — BLOOD GLUCOSE TEST VI STRP: ORAL_STRIP | Status: DC

## 2015-11-03 MED ORDER — FREESTYLE SYSTEM KIT: PACK | Status: DC

## 2015-11-03 NOTE — Patient Instructions (Signed)

## 2015-11-03 NOTE — Progress Notes (Signed)
Subjective:    Patient ID: Phillip Barrett, male    DOB: [birth year], 40 y.o.   MRN: [id number]  Pt presents to the office today for chronic follow up.  Diabetes He presents for his follow-up diabetic visit. He has type 2 diabetes mellitus. His disease course has been fluctuating. Pertinent negatives for hypoglycemia include no confusion or dizziness. Pertinent negatives for diabetes include no foot paresthesias, no foot ulcerations and no visual change. Symptoms are worsening. Pertinent negatives for diabetic complications include no nephropathy or peripheral neuropathy. Risk factors for coronary artery disease include diabetes mellitus, male sex, sedentary lifestyle, tobacco exposure and dyslipidemia. Current diabetic treatment includes oral agent (monotherapy). He is compliant with treatment most of the time. He is following a generally unhealthy diet. He participates in exercise daily. (Pt does not checked blood sugars ) An ACE inhibitor/angiotensin II receptor blocker is not being taken. He sees a podiatrist.Eye exam is not current.  Hyperlipidemia This is a chronic problem. The current episode started more than 1 year ago. The problem is controlled. Recent lipid tests were reviewed and are normal. Exacerbating diseases include diabetes. He has no history of hypothyroidism. Factors aggravating his hyperlipidemia include smoking. Pertinent negatives include no leg pain or myalgias. Current antihyperlipidemic treatment includes diet change and statins. The current treatment provides mild improvement of lipids. Risk factors for coronary artery disease include diabetes mellitus, dyslipidemia, hypertension, male sex and obesity.      Review of Systems  Constitutional: Negative.   HENT: Negative.   Respiratory: Negative.   Cardiovascular: Negative.   Gastrointestinal: Negative.   Endocrine: Negative.   Genitourinary: Negative.   Musculoskeletal: Negative.  Negative for myalgias.  Neurological:  Negative.  Negative for dizziness.  Hematological: Negative.   Psychiatric/Behavioral: Negative.  Negative for confusion.  All other systems reviewed and are negative.      Objective:   Physical Exam  Constitutional: He is oriented to person, place, and time. He appears well-developed and well-nourished. No distress.  HENT:  Head: Normocephalic.  Right Ear: External ear normal.  Left Ear: External ear normal.  Nose: Nose normal.  Mouth/Throat: Oropharynx is clear and moist.  Eyes: Pupils are equal, round, and reactive to light. Right eye exhibits no discharge. Left eye exhibits no discharge.  Neck: Normal range of motion. Neck supple. No thyromegaly present.  Cardiovascular: Normal rate, regular rhythm, normal heart sounds and intact distal pulses.   No murmur heard. Pulmonary/Chest: Effort normal and breath sounds normal. No respiratory distress. He has no wheezes.  Abdominal: Soft. Bowel sounds are normal. He exhibits no distension. There is no tenderness.  Musculoskeletal: Normal range of motion. He exhibits no edema or tenderness.  Neurological: He is alert and oriented to person, place, and time. He has normal reflexes. No cranial nerve deficit.  Skin: Skin is warm and dry. No rash noted. No erythema.  Psychiatric: He has a normal mood and affect. His behavior is normal. Judgment and thought content normal.  Vitals reviewed.     BP 126/87 mmHg  Pulse 72  Temp(Src) 97.4 F (36.3 C) (Oral)  Ht 5' 7"  (1.702 m)  Wt 218 lb (98.884 kg)  BMI 34.14 kg/m2\     Assessment & Plan:  1. Type 2 diabetes mellitus not at goal ([place]) - Bayer DCA Hb A1c Waived - CMP14+EGFR - Ambulatory referral to Ophthalmology - Glucose Blood (BLOOD GLUCOSE TEST STRIPS) STRP; May use whichever glucose test strip insurance will allow.  Use to check BG daily.  Dx:  250.02  Dispense: 100 each; Refill: 2 - glucose monitoring kit (FREESTYLE) monitoring kit; Ok to substitute whichever [employer]  will allow.  Use to check up to twice a day.  Dx:  250.02  Dispense: 1 each; Refill: 0  2. Hyperlipidemia - CMP14+EGFR - Lipid panel  3. Obesity (BMI 30-39.9) - CMP14+EGFR  4. Vitamin D deficiency - CMP14+EGFR - VITAMIN D 25 Hydroxy (Vit-D Deficiency, Fractures)   Continue all meds Labs pending Health Maintenance reviewed Diet and exercise encouraged RTO 6 months  Evelina Dun, FNP

## 2015-11-04 LAB — LIPID PANEL
CHOL/HDL RATIO: 2.6 ratio (ref 0.0–5.0)
CHOLESTEROL TOTAL: 154 mg/dL (ref 100–199)
HDL: 60 mg/dL (ref >39)
LDL CALC: 86 mg/dL (ref 0–99)
Triglycerides: 42 mg/dL (ref 0–149)
VLDL CHOLESTEROL CAL: 8 mg/dL (ref 5–40)

## 2015-11-04 LAB — CMP14+EGFR
ALBUMIN: 4.4 g/dL (ref 3.5–5.5)
ALK PHOS: 60 IU/L (ref 39–117)
ALT: 16 IU/L (ref 0–44)
AST: 23 IU/L (ref 0–40)
Albumin/Globulin Ratio: 1.6 (ref 1.2–2.2)
BUN / CREAT RATIO: 9 (ref 9–20)
BUN: 8 mg/dL (ref 6–24)
Bilirubin Total: 0.3 mg/dL (ref 0.0–1.2)
CALCIUM: 9.4 mg/dL (ref 8.7–10.2)
CO2: 24 mmol/L (ref 18–29)
CREATININE: 0.88 mg/dL (ref 0.76–1.27)
Chloride: 102 mmol/L (ref 96–106)
GFR, EST AFRICAN AMERICAN: 124 mL/min/{1.73_m2} (ref >59)
GFR, EST NON AFRICAN AMERICAN: 107 mL/min/{1.73_m2} (ref >59)
GLOBULIN, TOTAL: 2.8 g/dL (ref 1.5–4.5)
GLUCOSE: 82 mg/dL (ref 65–99)
Potassium: 4.7 mmol/L (ref 3.5–5.2)
Sodium: 139 mmol/L (ref 134–144)
TOTAL PROTEIN: 7.2 g/dL (ref 6.0–8.5)

## 2015-11-04 LAB — VITAMIN D 25 HYDROXY (VIT D DEFICIENCY, FRACTURES): Vit D, 25-Hydroxy: 24.7 ng/mL — ABNORMAL LOW (ref 30.0–100.0)

## 2015-11-16 ENCOUNTER — Telehealth: Payer: Self-pay | Admitting: Family

## 2015-11-16 DIAGNOSIS — E119 Type 2 diabetes mellitus without complications: Secondary | ICD-10-CM

## 2015-11-16 NOTE — Telephone Encounter (Signed)
Please address

## 2015-11-17 NOTE — Telephone Encounter (Signed)
Does patient have any sores or ulcers on feet? What does he see podiatry for?

## 2015-11-17 NOTE — Telephone Encounter (Signed)
Patient see's podiatrist because he cuts his toe nails and monitors his feet since he has diabetes.

## 2015-12-05 ENCOUNTER — Telehealth: Payer: Self-pay | Admitting: Family

## 2015-12-20 DIAGNOSIS — B351 Tinea unguium: Secondary | ICD-10-CM | POA: Diagnosis not present

## 2015-12-20 DIAGNOSIS — E1142 Type 2 diabetes mellitus with diabetic polyneuropathy: Secondary | ICD-10-CM | POA: Diagnosis not present

## 2015-12-28 ENCOUNTER — Telehealth: Payer: Self-pay | Admitting: Family

## 2015-12-28 NOTE — Telephone Encounter (Signed)
Pt is going to call and schedule eye exam

## 2016-05-03 DIAGNOSIS — L84 Corns and callosities: Secondary | ICD-10-CM | POA: Diagnosis not present

## 2016-05-03 DIAGNOSIS — B351 Tinea unguium: Secondary | ICD-10-CM | POA: Diagnosis not present

## 2016-05-03 DIAGNOSIS — E1142 Type 2 diabetes mellitus with diabetic polyneuropathy: Secondary | ICD-10-CM | POA: Diagnosis not present

## 2016-05-04 ENCOUNTER — Ambulatory Visit (INDEPENDENT_AMBULATORY_CARE_PROVIDER_SITE_OTHER): Payer: Medicare Other | Admitting: Family

## 2016-05-04 ENCOUNTER — Encounter: Payer: Self-pay | Admitting: Family

## 2016-05-04 VITALS — BP 128/73 | HR 79 | Temp 96.8°F | Ht 67.0 in | Wt 241.4 lb

## 2016-05-04 DIAGNOSIS — Z23 Encounter for immunization: Secondary | ICD-10-CM | POA: Diagnosis not present

## 2016-05-04 DIAGNOSIS — E119 Type 2 diabetes mellitus without complications: Secondary | ICD-10-CM

## 2016-05-04 DIAGNOSIS — E782 Mixed hyperlipidemia: Secondary | ICD-10-CM

## 2016-05-04 DIAGNOSIS — E669 Obesity, unspecified: Secondary | ICD-10-CM | POA: Diagnosis not present

## 2016-05-04 DIAGNOSIS — E559 Vitamin D deficiency, unspecified: Secondary | ICD-10-CM

## 2016-05-04 LAB — BAYER DCA HB A1C WAIVED: HB A1C (BAYER DCA - WAIVED): 6.7 % (ref <7.0)

## 2016-05-04 NOTE — Progress Notes (Signed)
 Subjective:    Patient ID: Phillip Barrett, male    DOB: 05/07/1976, 40 y.o.   MRN: [id number]  Pt presents to the office today for chronic follow up.  Diabetes  He presents for his follow-up diabetic visit. He has type 2 diabetes mellitus. His disease course has been fluctuating. Pertinent negatives for hypoglycemia include no confusion or dizziness. Pertinent negatives for diabetes include no foot paresthesias, no foot ulcerations and no visual change. Symptoms are worsening. Pertinent negatives for diabetic complications include no nephropathy or peripheral neuropathy. Risk factors for coronary artery disease include diabetes mellitus, male sex, sedentary lifestyle, tobacco exposure and dyslipidemia. Current diabetic treatment includes oral agent (monotherapy). He is compliant with treatment most of the time. He is following a generally unhealthy diet. He participates in exercise daily. (Pt does not checked blood sugars ) An ACE inhibitor/angiotensin II receptor blocker is not being taken. He sees a podiatrist (05/03/16).Eye exam is not current.  Hyperlipidemia  This is a chronic problem. The current episode started more than 1 year ago. The problem is controlled. Recent lipid tests were reviewed and are normal. Exacerbating diseases include diabetes and obesity. He has no history of hypothyroidism. Factors aggravating his hyperlipidemia include smoking. Pertinent negatives include no leg pain or myalgias. Current antihyperlipidemic treatment includes diet change and statins. The current treatment provides mild improvement of lipids. Risk factors for coronary artery disease include diabetes mellitus, dyslipidemia, hypertension, male sex and obesity.      Review of Systems  Constitutional: Negative.   HENT: Negative.   Respiratory: Negative.   Cardiovascular: Negative.   Gastrointestinal: Negative.   Endocrine: Negative.   Genitourinary: Negative.   Musculoskeletal: Negative.  Negative for  myalgias.  Neurological: Negative.  Negative for dizziness.  Hematological: Negative.   Psychiatric/Behavioral: Negative.  Negative for confusion.  All other systems reviewed and are negative.      Objective:   Physical Exam  Constitutional: He is oriented to person, place, and time. He appears well-developed and well-nourished. No distress.  HENT:  Head: Normocephalic.  Right Ear: External ear normal.  Left Ear: External ear normal.  Nose: Nose normal.  Mouth/Throat: Oropharynx is clear and moist.  Eyes: Pupils are equal, round, and reactive to light. Right eye exhibits no discharge. Left eye exhibits no discharge.  Neck: Normal range of motion. Neck supple. No thyromegaly present.  Cardiovascular: Normal rate, regular rhythm, normal heart sounds and intact distal pulses.   No murmur heard. Pulmonary/Chest: Effort normal and breath sounds normal. No respiratory distress. He has no wheezes.  Abdominal: Soft. Bowel sounds are normal. He exhibits no distension. There is no tenderness.  Musculoskeletal: Normal range of motion. He exhibits no edema or tenderness.  Neurological: He is alert and oriented to person, place, and time. He has normal reflexes. No cranial nerve deficit.  Skin: Skin is warm and dry. No rash noted. No erythema.  Psychiatric: He has a normal mood and affect. His behavior is normal. Judgment and thought content normal.  Vitals reviewed.     BP 128/73   Pulse 79   Temp (!) 96.8 F (36 C) (Oral)   Ht 5' 7" (1.702 m)   Wt 241 lb 6.4 oz (109.5 kg)   BMI 37.81 kg/m \     Assessment & Plan:  1. Type 2 diabetes mellitus not at goal (HCC) - CMP14+EGFR - Bayer DCA Hb A1c Waived  2. Mixed hyperlipidemia - CMP14+EGFR - Lipid panel  3. Obesity (BMI 30-39.9) - CMP14+EGFR    4. Vitamin D deficiency - CMP14+EGFR   Continue all meds Labs pending Health Maintenance reviewed Diet and exercise encouraged RTO 6 months  Evelina Dun, FNP

## 2016-05-04 NOTE — Patient Instructions (Signed)
Diabetes Mellitus and Food It is important for you to manage your blood sugar (glucose) level. Your blood glucose level can be greatly affected by what you eat. Eating healthier foods in the appropriate amounts throughout the day at about the same time each day will help you control your blood glucose level. It can also help slow or prevent worsening of your diabetes mellitus. Healthy eating may even help you improve the level of your blood pressure and reach or maintain a healthy weight.  General recommendations for healthful eating and cooking habits include:  Eating meals and snacks regularly. Avoid going long periods of time without eating to lose weight.  Eating a diet that consists mainly of plant-based foods, such as fruits, vegetables, nuts, legumes, and whole grains.  Using low-heat cooking methods, such as baking, instead of high-heat cooking methods, such as deep frying. Work with your dietitian to make sure you understand how to use the Nutrition Facts information on food labels. HOW CAN FOOD AFFECT ME? Carbohydrates Carbohydrates affect your blood glucose level more than any other type of food. Your dietitian will help you determine how many carbohydrates to eat at each meal and teach you how to count carbohydrates. Counting carbohydrates is important to keep your blood glucose at a healthy level, especially if you are using insulin or taking certain medicines for diabetes mellitus. Alcohol Alcohol can cause sudden decreases in blood glucose (hypoglycemia), especially if you use insulin or take certain medicines for diabetes mellitus. Hypoglycemia can be a life-threatening condition. Symptoms of hypoglycemia (sleepiness, dizziness, and disorientation) are similar to symptoms of having too much alcohol.  If your health care provider has given you approval to drink alcohol, do so in moderation and use the following guidelines:  Women should not have more than one drink per day, and men  should not have more than two drinks per day. One drink is equal to:  12 oz of beer.  5 oz of wine.  1 oz of hard liquor.  Do not drink on an empty stomach.  Keep yourself hydrated. Have water, diet soda, or unsweetened iced tea.  Regular soda, juice, and other mixers might contain a lot of carbohydrates and should be counted. WHAT FOODS ARE NOT RECOMMENDED? As you make food choices, it is important to remember that all foods are not the same. Some foods have fewer nutrients per serving than other foods, even though they might have the same number of calories or carbohydrates. It is difficult to get your body what it needs when you eat foods with fewer nutrients. Examples of foods that you should avoid that are high in calories and carbohydrates but low in nutrients include:  Trans fats (most processed foods list trans fats on the Nutrition Facts label).  Regular soda.  Juice.  Candy.  Sweets, such as cake, pie, doughnuts, and cookies.  Fried foods. WHAT FOODS CAN I EAT? Eat nutrient-rich foods, which will nourish your body and keep you healthy. The food you should eat also will depend on several factors, including:  The calories you need.  The medicines you take.  Your weight.  Your blood glucose level.  Your blood pressure level.  Your cholesterol level. You should eat a variety of foods, including:  Protein.  Lean cuts of meat.  Proteins low in saturated fats, such as fish, egg whites, and beans. Avoid processed meats.  Fruits and vegetables.  Fruits and vegetables that may help control blood glucose levels, such as apples, mangoes, and   yams.  Dairy products.  Choose fat-free or low-fat dairy products, such as milk, yogurt, and cheese.  Grains, bread, pasta, and rice.  Choose whole grain products, such as multigrain bread, whole oats, and brown rice. These foods may help control blood pressure.  Fats.  Foods containing healthful fats, such as nuts,  avocado, olive oil, canola oil, and fish. DOES EVERYONE WITH DIABETES MELLITUS HAVE THE SAME MEAL PLAN? Because every person with diabetes mellitus is different, there is not one meal plan that works for everyone. It is very important that you meet with a dietitian who will help you create a meal plan that is just right for you.   This information is not intended to replace advice given to you by your health care provider. Make sure you discuss any questions you have with your health care provider.   Document Released: 04/12/2005 Document Revised: 08/06/2014 Document Reviewed: 06/12/2013 Elsevier Interactive Patient Education 2016 [employer] Inc[employer]  

## 2016-05-07 ENCOUNTER — Other Ambulatory Visit: Payer: Self-pay | Admitting: Family

## 2016-05-07 LAB — CMP14+EGFR
ALBUMIN: 4.1 g/dL (ref 3.5–5.5)
ALT: 35 IU/L (ref 0–44)
AST: 24 IU/L (ref 0–40)
Albumin/Globulin Ratio: 1.5 (ref 1.2–2.2)
Alkaline Phosphatase: 67 IU/L (ref 39–117)
BILIRUBIN TOTAL: 0.2 mg/dL (ref 0.0–1.2)
BUN / CREAT RATIO: 7 — AB (ref 9–20)
BUN: 6 mg/dL (ref 6–24)
CALCIUM: 8.9 mg/dL (ref 8.7–10.2)
CHLORIDE: 96 mmol/L (ref 96–106)
CO2: 25 mmol/L (ref 18–29)
CREATININE: 0.82 mg/dL (ref 0.76–1.27)
GFR calc non Af Amer: 111 mL/min/{1.73_m2} (ref >59)
GFR, EST AFRICAN AMERICAN: 128 mL/min/{1.73_m2} (ref >59)
GLUCOSE: 476 mg/dL — AB (ref 65–99)
Globulin, Total: 2.8 g/dL (ref 1.5–4.5)
Potassium: 4.6 mmol/L (ref 3.5–5.2)
Sodium: 137 mmol/L (ref 134–144)
TOTAL PROTEIN: 6.9 g/dL (ref 6.0–8.5)

## 2016-05-07 LAB — LIPID PANEL
Chol/HDL Ratio: 3.7 ratio units (ref 0.0–5.0)
Cholesterol, Total: 174 mg/dL (ref 100–199)
HDL: 47 mg/dL (ref >39)
LDL CALC: 98 mg/dL (ref 0–99)
Triglycerides: 145 mg/dL (ref 0–149)
VLDL CHOLESTEROL CAL: 29 mg/dL (ref 5–40)

## 2016-05-07 MED ORDER — METFORMIN HCL ER 500 MG PO TB24: 500.0000 mg | ORAL_TABLET | Freq: Every day | ORAL | 1 refills | Status: DC

## 2016-05-22 DIAGNOSIS — Z7984 Long term (current) use of oral hypoglycemic drugs: Secondary | ICD-10-CM | POA: Diagnosis not present

## 2016-05-22 DIAGNOSIS — E119 Type 2 diabetes mellitus without complications: Secondary | ICD-10-CM | POA: Diagnosis not present

## 2016-05-22 LAB — HM DIABETES EYE EXAM

## 2016-05-28 ENCOUNTER — Other Ambulatory Visit: Payer: Self-pay | Admitting: Family

## 2016-05-28 DIAGNOSIS — E785 Hyperlipidemia, unspecified: Secondary | ICD-10-CM

## 2016-07-08 DIAGNOSIS — J029 Acute pharyngitis, unspecified: Secondary | ICD-10-CM | POA: Diagnosis not present

## 2016-07-08 DIAGNOSIS — F17200 Nicotine dependence, unspecified, uncomplicated: Secondary | ICD-10-CM | POA: Diagnosis not present

## 2016-07-08 DIAGNOSIS — E1165 Type 2 diabetes mellitus with hyperglycemia: Secondary | ICD-10-CM | POA: Diagnosis not present

## 2016-07-08 DIAGNOSIS — Z79899 Other long term (current) drug therapy: Secondary | ICD-10-CM | POA: Diagnosis not present

## 2016-07-08 DIAGNOSIS — L723 Sebaceous cyst: Secondary | ICD-10-CM | POA: Diagnosis not present

## 2016-07-08 DIAGNOSIS — Z7984 Long term (current) use of oral hypoglycemic drugs: Secondary | ICD-10-CM | POA: Diagnosis not present

## 2016-07-09 DIAGNOSIS — L720 Epidermal cyst: Secondary | ICD-10-CM | POA: Diagnosis not present

## 2016-07-12 DIAGNOSIS — E1142 Type 2 diabetes mellitus with diabetic polyneuropathy: Secondary | ICD-10-CM | POA: Diagnosis not present

## 2016-07-12 DIAGNOSIS — B351 Tinea unguium: Secondary | ICD-10-CM | POA: Diagnosis not present

## 2016-07-12 DIAGNOSIS — L84 Corns and callosities: Secondary | ICD-10-CM | POA: Diagnosis not present

## 2016-07-13 DIAGNOSIS — L720 Epidermal cyst: Secondary | ICD-10-CM | POA: Diagnosis not present

## 2016-08-07 ENCOUNTER — Ambulatory Visit: Payer: Self-pay | Admitting: Family

## 2016-08-13 ENCOUNTER — Telehealth: Payer: Self-pay | Admitting: Family

## 2016-08-13 NOTE — Telephone Encounter (Signed)
Ok thanks 

## 2016-08-17 ENCOUNTER — Ambulatory Visit: Payer: Self-pay | Admitting: Family

## 2016-08-24 ENCOUNTER — Ambulatory Visit (INDEPENDENT_AMBULATORY_CARE_PROVIDER_SITE_OTHER): Payer: Medicare Other | Admitting: Family

## 2016-08-24 ENCOUNTER — Encounter: Payer: Self-pay | Admitting: Family

## 2016-08-24 VITALS — BP 111/73 | HR 71 | Temp 97.6°F | Ht 67.0 in | Wt 236.6 lb

## 2016-08-24 DIAGNOSIS — E559 Vitamin D deficiency, unspecified: Secondary | ICD-10-CM

## 2016-08-24 DIAGNOSIS — E119 Type 2 diabetes mellitus without complications: Secondary | ICD-10-CM

## 2016-08-24 DIAGNOSIS — E669 Obesity, unspecified: Secondary | ICD-10-CM | POA: Diagnosis not present

## 2016-08-24 DIAGNOSIS — E785 Hyperlipidemia, unspecified: Secondary | ICD-10-CM | POA: Diagnosis not present

## 2016-08-24 LAB — BAYER DCA HB A1C WAIVED

## 2016-08-24 NOTE — Patient Instructions (Signed)
Diabetes Mellitus and Food It is important for you to manage your blood sugar (glucose) level. Your blood glucose level can be greatly affected by what you eat. Eating healthier foods in the appropriate amounts throughout the day at about the same time each day will help you control your blood glucose level. It can also help slow or prevent worsening of your diabetes mellitus. Healthy eating may even help you improve the level of your blood pressure and reach or maintain a healthy weight. General recommendations for healthful eating and cooking habits include:  Eating meals and snacks regularly. Avoid going long periods of time without eating to lose weight.  Eating a diet that consists mainly of plant-based foods, such as fruits, vegetables, nuts, legumes, and whole grains.  Using low-heat cooking methods, such as baking, instead of high-heat cooking methods, such as deep frying.  Work with your dietitian to make sure you understand how to use the Nutrition Facts information on food labels. How can food affect me? Carbohydrates Carbohydrates affect your blood glucose level more than any other type of food. Your dietitian will help you determine how many carbohydrates to eat at each meal and teach you how to count carbohydrates. Counting carbohydrates is important to keep your blood glucose at a healthy level, especially if you are using insulin or taking certain medicines for diabetes mellitus. Alcohol Alcohol can cause sudden decreases in blood glucose (hypoglycemia), especially if you use insulin or take certain medicines for diabetes mellitus. Hypoglycemia can be a life-threatening condition. Symptoms of hypoglycemia (sleepiness, dizziness, and disorientation) are similar to symptoms of having too much alcohol. If your health care provider has given you approval to drink alcohol, do so in moderation and use the following guidelines:  Women should not have more than one drink per day, and men  should not have more than two drinks per day. One drink is equal to: ? 12 oz of beer. ? 5 oz of wine. ? 1 oz of hard liquor.  Do not drink on an empty stomach.  Keep yourself hydrated. Have water, diet soda, or unsweetened iced tea.  Regular soda, juice, and other mixers might contain a lot of carbohydrates and should be counted.  What foods are not recommended? As you make food choices, it is important to remember that all foods are not the same. Some foods have fewer nutrients per serving than other foods, even though they might have the same number of calories or carbohydrates. It is difficult to get your body what it needs when you eat foods with fewer nutrients. Examples of foods that you should avoid that are high in calories and carbohydrates but low in nutrients include:  Trans fats (most processed foods list trans fats on the Nutrition Facts label).  Regular soda.  Juice.  Candy.  Sweets, such as cake, pie, doughnuts, and cookies.  Fried foods.  What foods can I eat? Eat nutrient-rich foods, which will nourish your body and keep you healthy. The food you should eat also will depend on several factors, including:  The calories you need.  The medicines you take.  Your weight.  Your blood glucose level.  Your blood pressure level.  Your cholesterol level.  You should eat a variety of foods, including:  Protein. ? Lean cuts of meat. ? Proteins low in saturated fats, such as fish, egg whites, and beans. Avoid processed meats.  Fruits and vegetables. ? Fruits and vegetables that may help control blood glucose levels, such as apples,   mangoes, and yams.  Dairy products. ? Choose fat-free or low-fat dairy products, such as milk, yogurt, and cheese.  Grains, bread, pasta, and rice. ? Choose whole grain products, such as multigrain bread, whole oats, and brown rice. These foods may help control blood pressure.  Fats. ? Foods containing healthful fats, such as  nuts, avocado, olive oil, canola oil, and fish.  Does everyone with diabetes mellitus have the same meal plan? Because every person with diabetes mellitus is different, there is not one meal plan that works for everyone. It is very important that you meet with a dietitian who will help you create a meal plan that is just right for you. This information is not intended to replace advice given to you by your health care provider. Make sure you discuss any questions you have with your health care provider. Document Released: 04/12/2005 Document Revised: 12/22/2015 Document Reviewed: 06/12/2013 Elsevier Interactive Patient Education  2017 Elsevier Inc.  

## 2016-08-24 NOTE — Progress Notes (Signed)
Subjective:    Patient ID: Phillip Barrett, male    DOB: 1975-12-04, 41 y.o.   MRN: [id number]  Pt presents to the office today for chronic follow up.  Diabetes  He presents for his follow-up diabetic visit. He has type 2 diabetes mellitus. His disease course has been fluctuating. Pertinent negatives for hypoglycemia include no confusion or dizziness. Pertinent negatives for diabetes include no foot paresthesias, no foot ulcerations and no visual change. Symptoms are worsening. Pertinent negatives for diabetic complications include no nephropathy or peripheral neuropathy. Risk factors for coronary artery disease include diabetes mellitus, male sex, sedentary lifestyle, tobacco exposure and dyslipidemia. Current diabetic treatment includes oral agent (monotherapy). He is compliant with treatment most of the time. He is following a generally unhealthy diet. He participates in exercise daily. (Pt does not checked blood sugars ) An ACE inhibitor/angiotensin II receptor blocker is not being taken. He sees a podiatrist (05/03/16).Eye exam is current (05/2016).  Hyperlipidemia  This is a chronic problem. The current episode started more than 1 year ago. The problem is controlled. Recent lipid tests were reviewed and are normal. Exacerbating diseases include diabetes and obesity. He has no history of hypothyroidism. Factors aggravating his hyperlipidemia include smoking. Pertinent negatives include no leg pain or myalgias. Current antihyperlipidemic treatment includes diet change and statins. The current treatment provides mild improvement of lipids. Risk factors for coronary artery disease include diabetes mellitus, dyslipidemia, hypertension, male sex and obesity.      Review of Systems  Constitutional: Negative.   HENT: Negative.   Respiratory: Negative.   Cardiovascular: Negative.   Gastrointestinal: Negative.   Endocrine: Negative.   Genitourinary: Negative.   Musculoskeletal: Negative.  Negative for  myalgias.  Neurological: Negative.  Negative for dizziness.  Hematological: Negative.   Psychiatric/Behavioral: Negative.  Negative for confusion.  All other systems reviewed and are negative.      Objective:   Physical Exam  Constitutional: He is oriented to person, place, and time. He appears well-developed and well-nourished. No distress.  HENT:  Head: Normocephalic.  Right Ear: External ear normal.  Left Ear: External ear normal.  Nose: Nose normal.  Mouth/Throat: Oropharynx is clear and moist.  Eyes: Pupils are equal, round, and reactive to light. Right eye exhibits no discharge. Left eye exhibits no discharge.  Neck: Normal range of motion. Neck supple. No thyromegaly present.  Cardiovascular: Normal rate, regular rhythm, normal heart sounds and intact distal pulses.   No murmur heard. Pulmonary/Chest: Effort normal and breath sounds normal. No respiratory distress. He has no wheezes.  Abdominal: Soft. Bowel sounds are normal. He exhibits no distension. There is no tenderness.  Musculoskeletal: Normal range of motion. He exhibits no edema or tenderness.  Neurological: He is alert and oriented to person, place, and time. He has normal reflexes. No cranial nerve deficit.  Skin: Skin is warm and dry. No rash noted. No erythema.  Psychiatric: He has a normal mood and affect. His behavior is normal. Judgment and thought content normal.  Vitals reviewed.     BP 111/73   Pulse 71   Temp 97.6 F (36.4 C) (Oral)   Ht '5\' 7"'$  (1.702 m)   Wt 236 lb 9.6 oz (107.3 kg)   BMI 37.06 kg/m      Assessment & Plan:  1. Type 2 diabetes mellitus not at goal [facility])  - CMP14+EGFR - Bayer DCA Hb A1c Waived - Microalbumin / creatinine urine ratio  2. Vitamin D deficiency - CMP14+EGFR  3. Obesity (BMI  30-39.9)  - CMP14+EGFR  4. Hyperlipidemia, unspecified hyperlipidemia type  - CMP14+EGFR - Lipid panel   Continue all meds Labs pending Health Maintenance reviewed Diet and  exercise encouraged RTO 6 months   Evelina Dun, FNP

## 2016-08-25 LAB — CMP14+EGFR
A/G RATIO: 1.4 (ref 1.2–2.2)
ALT: 17 IU/L (ref 0–44)
AST: 16 IU/L (ref 0–40)
Albumin: 4.3 g/dL (ref 3.5–5.5)
Alkaline Phosphatase: 70 IU/L (ref 39–117)
BUN/Creatinine Ratio: 11 (ref 9–20)
BUN: 8 mg/dL (ref 6–24)
Bilirubin Total: <0.2 mg/dL (ref 0.0–1.2)
CALCIUM: 9.6 mg/dL (ref 8.7–10.2)
CO2: 22 mmol/L (ref 18–29)
CREATININE: 0.75 mg/dL — AB (ref 0.76–1.27)
Chloride: 98 mmol/L (ref 96–106)
GFR, EST AFRICAN AMERICAN: 133 mL/min/{1.73_m2} (ref >59)
GFR, EST NON AFRICAN AMERICAN: 115 mL/min/{1.73_m2} (ref >59)
Globulin, Total: 3.1 g/dL (ref 1.5–4.5)
Glucose: 210 mg/dL — ABNORMAL HIGH (ref 65–99)
Potassium: 4.6 mmol/L (ref 3.5–5.2)
Sodium: 139 mmol/L (ref 134–144)
TOTAL PROTEIN: 7.4 g/dL (ref 6.0–8.5)

## 2016-08-25 LAB — LIPID PANEL
Chol/HDL Ratio: 3.7 ratio units (ref 0.0–5.0)
Cholesterol, Total: 169 mg/dL (ref 100–199)
HDL: 46 mg/dL (ref >39)
LDL Calculated: 102 mg/dL — ABNORMAL HIGH (ref 0–99)
Triglycerides: 103 mg/dL (ref 0–149)
VLDL CHOLESTEROL CAL: 21 mg/dL (ref 5–40)

## 2016-08-25 LAB — MICROALBUMIN / CREATININE URINE RATIO
Creatinine, Urine: 63.2 mg/dL
MICROALB/CREAT RATIO: 5.5 mg/g{creat} (ref 0.0–30.0)
MICROALBUM., U, RANDOM: 3.5 ug/mL

## 2016-08-27 ENCOUNTER — Other Ambulatory Visit: Payer: Self-pay | Admitting: Family

## 2016-08-27 MED ORDER — METFORMIN HCL 1000 MG PO TABS: 1000.0000 mg | ORAL_TABLET | Freq: Two times a day (BID) | ORAL | 1 refills | Status: DC

## 2016-08-31 ENCOUNTER — Telehealth: Payer: Self-pay | Admitting: Family

## 2016-09-05 ENCOUNTER — Encounter: Payer: Self-pay | Admitting: Pharmacist

## 2016-09-05 ENCOUNTER — Ambulatory Visit (INDEPENDENT_AMBULATORY_CARE_PROVIDER_SITE_OTHER): Payer: Medicare Other | Admitting: Pharmacist

## 2016-09-05 VITALS — BP 112/72 | HR 84 | Ht 67.0 in | Wt 236.5 lb

## 2016-09-05 DIAGNOSIS — E1165 Type 2 diabetes mellitus with hyperglycemia: Secondary | ICD-10-CM

## 2016-09-05 DIAGNOSIS — IMO0001 Reserved for inherently not codable concepts without codable children: Secondary | ICD-10-CM

## 2016-09-05 MED ORDER — BLOOD GLUCOSE MONITOR KIT: PACK | 0 refills | Status: DC

## 2016-09-05 NOTE — Patient Instructions (Signed)
Diabetes and Standards of Medical Care   Diabetes is complicated. You may find that your diabetes team includes a dietitian, nurse, diabetes educator, eye doctor, and more. To help everyone know what is going on and to help you get the care you deserve, the following schedule of care was developed to help keep you on track. Below are the tests, exams, vaccines, medicines, education, and plans you will need.  Blood Glucose Goals Prior to meals = 80 - 130 Within 2 hours of the start of a meal = less than 180  HbA1c test (goal is less than 7.0% - your last value was over 14%) This test shows how well you have controlled your glucose over the past 2 to 3 months. It is used to see if your diabetes management plan needs to be adjusted.   It is performed at least 2 times a year if you are meeting treatment goals.  It is performed 4 times a year if therapy has changed or if you are not meeting treatment goals.  Blood pressure test  This test is performed at every routine medical visit. The goal is less than 140/90 mmHg for most people, but 130/80 mmHg in some cases. Ask your health care provider about your goal.  Dental exam  Follow up with the dentist regularly.  Eye exam  If you are diagnosed with type 1 diabetes as a child, get an exam upon reaching the age of 10 years or older and have had diabetes for 3 to 5 years. Yearly eye exams are recommended after that initial eye exam.  If you are diagnosed with type 1 diabetes as an adult, get an exam within 5 years of diagnosis and then yearly.  If you are diagnosed with type 2 diabetes, get an exam as soon as possible after the diagnosis and then yearly.  Foot care exam  Visual foot exams are performed at every routine medical visit. The exams check for cuts, injuries, or other problems with the feet.  A comprehensive foot exam should be done yearly. This includes visual inspection as well as assessing foot pulses and testing for loss of  sensation.  Check your feet nightly for cuts, injuries, or other problems with your feet. Tell your health care provider if anything is not healing.  Kidney function test (urine microalbumin)  This test is performed once a year.  Type 1 diabetes: The first test is performed 5 years after diagnosis.  Type 2 diabetes: The first test is performed at the time of diagnosis.  A serum creatinine and estimated glomerular filtration rate (eGFR) test is done once a year to assess the level of chronic kidney disease (CKD), if present.  Lipid profile (cholesterol, HDL, LDL, triglycerides)  Performed every 5 years for most people.  The goal for LDL is less than 100 mg/dL. If you are at high risk, the goal is less than 70 mg/dL.  The goal for HDL is 40 mg/dL to 50 mg/dL for men and 50 mg/dL to 60 mg/dL for women. An HDL cholesterol of 60 mg/dL or higher gives some protection against heart disease.  The goal for triglycerides is less than 150 mg/dL.  Influenza vaccine, pneumococcal vaccine, and hepatitis B vaccine  The influenza vaccine is recommended yearly.  The pneumococcal vaccine is generally given once in a lifetime. However, there are some instances when another vaccination is recommended. Check with your health care provider.  The hepatitis B vaccine is also recommended for adults with diabetes.    Diabetes self-management education  Education is recommended at diagnosis and ongoing as needed.  Treatment plan  Your treatment plan is reviewed at every medical visit.  Document Released: 05/13/2009 Document Revised: 03/18/2013 Document Reviewed: 12/16/2012 [employer] Patient Information 2014 [employer]   

## 2016-09-05 NOTE — Progress Notes (Signed)
Patient ID: Phillip Barrett, male   DOB: 02/20/1976, 41 y.o.   MRN: [id number]   Subjective:    Phillip Barrett is a 41 y.o. male who presents for an evaluation of Type 2 diabetes mellitus.  I have seen patietn in past but only for 1 visit and was 03/2014.  His DM had been well controlled until recently when he stopped metformin.  A1c increased from 6.7% (05/04/2016) to over 14% 08/24/2016.  Patient reveals that he has not been checked in HBG because he had to move out of the hotel room he was staying in and is not sleeping on a friends floor.  His glucometer is either in storage or lost.  Patient has a girlfriend and they are trying to find a place that they can stay together.   Patient also states that he had not been able to follow CHO counting diet because he eats what he is able to afford or what he receives from church in [place].     Current symptoms/problems include hyperglycemia and polyuria and have been unchanged. Symptoms have been present for 2 months.   Known diabetic complications: none Cardiovascular risk factors: diabetes mellitus, dyslipidemia, male gender, obesity (BMI >= 30 kg/m2), sedentary lifestyle and smoking/ tobacco exposure Current diabetic medications include just restarted metformin 1000mg  bid about 1 week ago.   Eye exam current (within one year): no Weight trend: stable Prior visit with CDE: yes - but has been about 2.5 years ago  Is He on ACE inhibitor or angiotensin II receptor blocker?  No  microalbumin WNL 08/24/2016    The following portions of the patient's history were reviewed and updated as appropriate: allergies, current medications, past family history, past medical history, past social history, past surgical history and problem list.    Objective:    BP 112/72   Pulse 84   Ht 5\' 7"  (1.702 m)   Wt 236 lb 8 oz (107.3 kg)   BMI 37.04 kg/m   Lab Review Glucose (mg/dL)  Date Value  08/24/2016 210 (H)  05/04/2016 476 (>)  11/03/2015 82   CO2  (mmol/L)  Date Value  08/24/2016 22  05/04/2016 25  11/03/2015 24   BUN (mg/dL)  Date Value  08/24/2016 8  05/04/2016 6  11/03/2015 8   Creatinine, Ser (mg/dL)  Date Value  08/24/2016 0.75 (L)  05/04/2016 0.82  11/03/2015 0.88   A1c = over 14%    Assessment:    Diabetes Mellitus type II, under inadequate control.   Food insecurity and other social complications contributing to poorly controlled CM   Plan:    1.  Rx changes: continue Metformin 1000mg  bid.     Patient given #7 Januvia 100mg  to take 1 daily for 7 days - will follow up in 1 week to check BG control  Patient given sample of Glucerna protein shakes 2.  Education: Reviewed 'ABCs' of diabetes management (respective goals in parentheses):  A1C (<7), blood pressure (<130/80), and cholesterol (LDL <100). 3. Referral to [facility] for community resource assistance 4. CHO counting diet discussed.  Reviewed CHO amount in various foods and how to read nutrition labels.  Discussed recommended serving sizes.  5.  Recommend check BG 2  times a day - Accu Check Guide glucometer given in office today.  Also pt give Rx for glucometer and supplies - can choose whichever glucometer insurance will allow 6.  Recommended increase physical activity - goal is 150 minutes per week 7. Follow up: 1 month

## 2016-09-07 ENCOUNTER — Other Ambulatory Visit: Payer: Self-pay | Admitting: *Deleted

## 2016-09-07 NOTE — Patient Outreach (Signed)
[facility]) Care Management  09/07/2016  Phillip Barrett October 25, 1975 [id number]   MD referral from [facility]/Pharmacist-assist with community resources and education.  Telephone call to patient who was advised of referral & [facility] care management services; HIPPA verification received from patient. Patient states he was aware of the referral.   States major concern is finding somewhere to live for he and girlfriend. States he is currently living at [facility] (states use to be [facility]) in [place], [place], (does not know specific address). Has been at current hotel x 2 months.  States mailing address is [address], [place], [place] [address]. States he can be reached on his government phone which has limited minutes on [phone]. States finances not good. Patient is aware of food pantry in area where he can get food.  States able to get medications via insurance coverage which is Medicare and Medicaid; uses [employer].  States aspirin & glucometer was packed up in storage unit and does have not access now until rent paid for unit.  Patient states he has not received disability check since Nov 2017.    States recent visit to Pharmacist-Tammy Eckard at MD office who gave him glucometer to start checking his blood sugars twice daily.  Also states he was given sample -Januvia to take 1 tablet a day x 7 days to get blood sugar down. States he is taking metformin also but sometimes falls asleep before taking 2nd dose States blood sugar before breakfast for last 2 days have been 134 & 136 respectively. States target is 80-130.  S    Patient states last visit with primary care office 08/24/16 with [facility].  States diabetes was not controlled. Patient did not know A1c level.  (per documentation A1C level was 14 on 08/24/2016).  Patient states has had diabetes x 10 years and can't remember when he had any outpatient classes.   Patient consents to [facility] care management  services.  Will refer to [facility] care coordinator & Clinical social worker for complex  case management.   Plan: Refer to care management to assign Clinical Social Worker for [employer] (pt has uncontrolled diabetes with recent A1C level 14.0.    Sherrin Daisy, RN BSN [place] Management Coordinator [facility] Care Management  [phone]

## 2016-09-10 ENCOUNTER — Other Ambulatory Visit: Payer: Self-pay | Admitting: *Deleted

## 2016-09-10 NOTE — Patient Outreach (Signed)
Telephone call to pt to schedule initial home visit, no answer to [phone] and states "voicemail not set up", called alternate number [phone] and no answer, received voicemail that is not patient's identity, did not leave voicemail.  PLAN Attempt to reach pt tomorrow  Jacqlyn Larsen [facility], [place] Coordinator [phone]

## 2016-09-11 ENCOUNTER — Other Ambulatory Visit: Payer: Self-pay | Admitting: *Deleted

## 2016-09-11 DIAGNOSIS — M7989 Other specified soft tissue disorders: Secondary | ICD-10-CM | POA: Diagnosis not present

## 2016-09-11 DIAGNOSIS — F17200 Nicotine dependence, unspecified, uncomplicated: Secondary | ICD-10-CM | POA: Diagnosis not present

## 2016-09-11 DIAGNOSIS — Z7984 Long term (current) use of oral hypoglycemic drugs: Secondary | ICD-10-CM | POA: Diagnosis not present

## 2016-09-11 DIAGNOSIS — Z79899 Other long term (current) drug therapy: Secondary | ICD-10-CM | POA: Diagnosis not present

## 2016-09-11 DIAGNOSIS — E119 Type 2 diabetes mellitus without complications: Secondary | ICD-10-CM | POA: Diagnosis not present

## 2016-09-11 DIAGNOSIS — M7741 Metatarsalgia, right foot: Secondary | ICD-10-CM | POA: Diagnosis not present

## 2016-09-11 NOTE — Patient Outreach (Signed)
RN CM called pt to schedule initial home visit, spoke with pt, HIPAA verified, pt reports he and girlfriend checked out of hotel today and will no longer be able to stay at hotel because of finances. Pt states he has limited minutes on his telephone and conversations need to be brief.  Pt reports he "has no idea where we are staying"  RN CM talked with pt about homeless shelters in the county and pt spoke of some type of classes he has to take first.  Pt agreeable to have [facility] CSW contact him today about resources.  RN CM sent high priority In Basket to [place] with report and requesting she contact pt, sent email and left voicemail as well.  CSW [employer] called RN CM and received update/ report and she will contact pt and provide resources.  PLAN Collaborate with CSW about homelessness issue Reach out to pt in few days  Jacqlyn Larsen [facility], [place] Coordinator [phone]

## 2016-09-11 NOTE — Patient Outreach (Signed)
[facility]) Care Management  09/11/2016  Phillip Barrett 1976-01-26 [id number]   Acute phone call to patient to provide community resources for homeless shelters, in the absence of the social worker Theadore Nan who is assigned to cover the [facility] area.  Patient states that he was not able to afford another stay at the [facility].  Per patient, his father who currently resides in [place],  will be sending him some money on Saturday to pay for another hotel stay, so he will just need a place to stay until then.  Patient reports having  no income at this time. His disability  was stopped while incarcerated for approximately 30 days.  Per patient, his father is assisting him with getting his disability re-instated.  Patient has a girlfriend, they are looking for a place where they can live together. Contact information for the [facility] [phone] provided to patient. It was recommended that he ask about the emergency shelter in [place] as well.  Patient hesitant to call the shelter, patient strongly encouraged to call.  This social worker will contact patient on 09/12/16 for an update.   Sheralyn Boatman [facility] Care Management [phone]

## 2016-09-12 ENCOUNTER — Other Ambulatory Visit: Payer: Self-pay | Admitting: *Deleted

## 2016-09-12 NOTE — Patient Outreach (Signed)
[facility]) Care Management  09/12/2016  Brayam Garsee [birth year] [id number]   Phone call to patient to follow up on referral to the [facility] provided yesterday.  There was no answer, message received stating that he had a voicemail that had to been set up yet.   This Education officer, museum will try to reach patient by the end of the week.    Sheralyn Boatman [facility] Care Management [phone]

## 2016-09-13 ENCOUNTER — Other Ambulatory Visit: Payer: Self-pay | Admitting: *Deleted

## 2016-09-13 NOTE — Patient Outreach (Signed)
Telephone call to pt for follow up, spoke with pt, HIPAA verified, pt reports he is staying temporarily at a friend's house until Saturday and will then hopefully go back to the hotel and his father will be paying the bill.  Pt reports his friend will not allow home visits at his house.  Pt reminded RN CM that he has government phone and can only talk briefly, pt reports he has been checking CBG and most readings in 100's range.  RN CM did briefly talk with pt about carbohydrate modified diet and food choices, pt is interested in home visit and requests that RN CM call back on Monday and hopefully pt will at hotel and visit can then be scheduled.  RN CM unable to complete further assessments over the telephone due to limited conversation.  RN CM sent in basket to [place] with update.  PLAN Call pt beginning of next week Schedule home visit  Jacqlyn Larsen [facility], [place] Coordinator [phone]

## 2016-09-17 ENCOUNTER — Other Ambulatory Visit: Payer: Self-pay | Admitting: *Deleted

## 2016-09-17 ENCOUNTER — Encounter: Payer: Self-pay | Admitting: *Deleted

## 2016-09-17 NOTE — Patient Outreach (Signed)
Telephone call to pt to schedule home visit, RN CM tried multiple times to reach pt at [phone] and received message on phone stating " call failed",unable to place call.  RN CM mailed letter to patient at mailing address [address], [place] [address] requesting return phone call.  Jacqlyn Larsen [facility], [place] Coordinator [phone]

## 2016-09-18 ENCOUNTER — Encounter: Payer: Self-pay | Admitting: Licensed Clinical Social Worker

## 2016-09-18 ENCOUNTER — Other Ambulatory Visit: Payer: Self-pay | Admitting: *Deleted

## 2016-09-18 ENCOUNTER — Other Ambulatory Visit: Payer: Self-pay | Admitting: Licensed Clinical Social Worker

## 2016-09-18 NOTE — Patient Outreach (Signed)
Assessment:  CSW Phillip Barrett received referral on [employer].  Phillip Land LCSW was covering for [employer] recently and Phillip contacted Phillip Barrett recently and spoke with Phillip Barrett about Phillip Barrett needs faced. [place] had provided Phillip Barrett with information on local  emergency housing resources in [place], [place] (emergency shelter name and phone number in [place], [place]).  Phillip Barrett was previously residing with his girlfriend at [facility] in [place], [place] (for past two months). Phillip Barrett has financial challenges and has stated that he has not received a disability check since November of 2017. Phillip Barrett also informed Phillip  Barrett recently that Phillip Barrett was also incarcerated for 30 days recently and is now out of jail.  Phillip Barrett has some financial support from his father who lives in [place].  CSW Phillip Barrett called Phillip Barrett on 09/18/16 and spoke via phone with Phillip Barrett. CSW Phillip Barrett verified Phillip Barrett identity. CSW Phillip Barrett received verbal permission from Phillip Barrett on 09/18/16 for CSW to speak with Phillip Barrett about current Phillip Barrett needs. CSW and Phillip Barrett completed needed [facility] assessments for Phillip Barrett. CSW and Phillip Barrett spoke of care plan for Phillip Barrett.  CSW encouraged Phillip Barrett to commuincate with CSW in next 30 days to discuss housing resources for Phillip Barrett in the area. Phillip Barrett also has had some difficulty obtaining needed food resources regularly. Phillip Barrett had reported that he was aware of local food pantry resources in the area.   Phillip Barrett informed CSW on 09/18/16 that Phillip Barrett was going to [employer] resources center on 09/19/16 to seek food assistance for Phillip Barrett.   Phillip Barrett said he did not have a car and was depending on friends to help him run needed community errands. Phillip Barrett said he also uses [place] as needed to transport Phillip Barrett to and from Phillip Barrett's scheduled medical appointments. Phillip Barrett said he is staying temporarily with a friend at present and is waiting to receive needed funds from his father to be  able to pay for housing expenses for Phillip Barrett in [place], [place] at a motel.  Phillip Barrett said he is not having any pain issues. He said he hears well. He said he wears glasses to help with vision. He has had no falls. He said he was not depressed. CSW informed Phillip Barrett that RN Jacqlyn Larsen had mailed Phillip Barrett letter recently and asked for Phillip Barrett to return call to RN Jacqlyn Larsen to discuss nursing needs of Phillip Barrett. Phillip Barrett was communicative and was appreciative of [facility] staff support and communication. CSW encouraged [place] to call CSW at [phone] as needed to discuss social work needs of Phillip Barrett. Phillip Barrett was appreciative of call from [place] on 09/18/16.     Plan:  Phillip Barrett to communicate with CSW in next 30 days to discuss housing resources for Phillip Barrett in the area.   CSW to collaborate with RN Jacqlyn Larsen as needed to monitor Phillip Barrett needs.  CSW to call Phillip Barrett in 3 weeks to assess Phillip Barrett housing needs at that time.   Phillip Barrett.Phillip Barrett MSW, LCSW Licensed Clinical Social Worker [facility] Care Management [phone]

## 2016-09-18 NOTE — Patient Outreach (Signed)
Telephone call with [facility] CSW Theadore Nan for collaboration, CSW talked with pt at phone number [phone] and reports pt is still staying with a friend in [place] and still plans to go back to hotel in [place].  CSW continues to work with pt on financial issues, food procurement and living situation/ homelessness. RN CM called pt at [phone] with no answer to telephone and received voicemail that does not identify with pt so no voicemail left.  Pt had previously states that his friend will not allow home visits at his house.  PLAN Attempt to reach pt at end of this week  Jacqlyn Larsen [facility], [place] Coordinator [phone]

## 2016-09-21 ENCOUNTER — Other Ambulatory Visit: Payer: Self-pay | Admitting: *Deleted

## 2016-09-21 NOTE — Patient Outreach (Signed)
RN CM called pt for follow up and schedule home visit, called [place] phone [phone] and [facility] states pt is in bathroom and not available, Hope took contact number for RN CM and will have pt call RN CM back.  Hope states "we are still living with a friend".  PLAN RN CM will await call back from pt  Jacqlyn Larsen [facility], [place] Coordinator [phone]

## 2016-09-27 ENCOUNTER — Other Ambulatory Visit: Payer: Self-pay | Admitting: *Deleted

## 2016-09-27 NOTE — Patient Outreach (Signed)
Telephone call to pt for follow up on living situation and to schedule home visit, no answer to [phone] and received message stating " not available", no answer to [phone] and left voicemail requesting return phone call.  Jacqlyn Larsen [facility], [place] Coordinator [phone]

## 2016-10-01 ENCOUNTER — Other Ambulatory Visit: Payer: Self-pay | Admitting: *Deleted

## 2016-10-01 NOTE — Patient Outreach (Signed)
Case closure for RN CM only.  Difficult to maintain contact with pt and pt does not have permanent residence at present and will not allow home visit, has limited minutes on telephone.  CSW continues working with pt and will send case closure letters when case completely closed out.  Jacqlyn Larsen [facility], [place] Coordinator [phone]

## 2016-10-03 ENCOUNTER — Other Ambulatory Visit: Payer: Self-pay | Admitting: *Deleted

## 2016-10-03 NOTE — Patient Outreach (Signed)
Pt called RN CM and states he is at homeless shelter in [place] but does not know name of the shelter or telephone number, not sure if they allow any visitors,  Pt states this is not his long range plan and does intend to leave homeless shelter.  Pt states he continues to have limited minutes on his phone. RN CM talked with [facility] CSW Theadore Nan and informed of the above,  CSW will look up different shelters in [place] and try to get back in touch with pt.  If CSW determines pt is at a place where he can have visitors or moves to another residence where he can have visits, CSW will let RN CM know and case for nursing can be reopened.  Jacqlyn Larsen [facility], [place] Coordinator [phone]

## 2016-10-09 ENCOUNTER — Other Ambulatory Visit: Payer: Self-pay | Admitting: Licensed Clinical Social Worker

## 2016-10-09 NOTE — Patient Outreach (Signed)
Assessment:  CSW spoke via phone with client. CSW verified client identity. CSW received verbal permission from client on 10/09/16 for CSW to speak with client about current client needs. Client sees Evelina Dun, Family Nurse Practitioner, as medical provider.  CSW and client spoke of client care plan. CSW encouraged client to communicate with CSW in next 30 days to discuss housing resources for client in the area. Client said he has gone recently to [employer] to get needed food items. Client has no car and said he is dependent on others to give him a ride to the food pantry or to complete errands needed. Client said he did utilize [place] to transport client to and from client's scheduled medical appointments. Client said he has financial challenges. His father is helping client in seeking reinstatement of client's disability benefit. .Client said he has not received disability income benefit since November of 2017.  However, client said also that he was incarcerated for a period of 30 days and this incarceration may have had an affect on his receiving disability benefits for client. Since he does not now receive disability benefits, client has no active source of monthly income. Client said that his father has periodically sent client money to help with client bills due.  Client said he sometimes has limited minutes on his phone. Client has not had a stable residence in a number of weeks. He has stayed temporarily at a homeless shelter. He has also stayed temporarily with friends.  Client reported to [place] on 10/09/16 that client and his girlfriend, Buford, were staying now at a shelter in [place], [place]. He said he and Hope plan to remain at that shelter for a number of weeks. He said he is trying to save money to be able to afford for him and his girlfriend to rent a local motel room in the near future. He said he is scheduled to have appointment with his probation  officer in near future. He said he is trying to attend scheduled client medical appointments.  CSW encouraged client to call CSW at [phone] as needed to discuss social work needs of client. Client was appreciative of phone call from [place] on 10/09/16.    Plan:  Client to communicate with CSW in next 30 days to discuss housing resources for client in the area.  CSW to call client in 4 weeks to assess client needs at that time.  Norva Riffle.Autry Droege MSW, LCSW Licensed Clinical Social Worker [facility] Care Management [phone]

## 2016-10-10 ENCOUNTER — Ambulatory Visit: Payer: Self-pay | Admitting: Pharmacist

## 2016-10-11 ENCOUNTER — Encounter: Payer: Self-pay | Admitting: Family

## 2016-10-31 ENCOUNTER — Ambulatory Visit: Payer: Medicare Other | Admitting: Pharmacist

## 2016-11-02 ENCOUNTER — Ambulatory Visit: Payer: Self-pay | Admitting: Family

## 2016-11-08 ENCOUNTER — Other Ambulatory Visit: Payer: Self-pay | Admitting: Licensed Clinical Social Worker

## 2016-11-08 DIAGNOSIS — L84 Corns and callosities: Secondary | ICD-10-CM | POA: Diagnosis not present

## 2016-11-08 DIAGNOSIS — M79676 Pain in unspecified toe(s): Secondary | ICD-10-CM | POA: Diagnosis not present

## 2016-11-08 DIAGNOSIS — E1142 Type 2 diabetes mellitus with diabetic polyneuropathy: Secondary | ICD-10-CM | POA: Diagnosis not present

## 2016-11-08 DIAGNOSIS — B351 Tinea unguium: Secondary | ICD-10-CM | POA: Diagnosis not present

## 2016-11-08 NOTE — Patient Outreach (Signed)
Assessment:  CSW spoke via phone with client. CSW verified client identity. CSW received verbal permission from client on 11/08/16 for CSW to communicate with client about current needs and status of client. Client sees Evelina Dun, [facility] Nurse Practitioner as primary care provider. CSW and client spoke of client care plan. CSW encouraged client to communicate with CSW in next 30 days to discuss housing resources for client in the area. Client said he has gone previously to [employer] to seek food assistance. Client said he did not have a car and is dependent on family or friends to transport client to and from client's scheduled medical appointments or to complete client errands. Client said he did sometimes use [place] to transport client to and from client's scheduled medical appointments. Client continues to have financial challenges. He said that his father is helping him to seek reinstatement of client's Disability benefit.Client said that his father occasionally helps client financially with paying some of client's bills. Client has had difficulty in recent months in maintaining a stable residence. Client reported to [place] on 10/09/16 that he and his girlfriend, Hughes, were staying at a shelter in [place], [place]. He is hoping to reside at that shelter for a number of weeks until he can save some money to be able to rent a motel room again. CSW was informed by client on 11/08/16 that [facility] will be closing at the end of April of 2018. Client said that he and [facility] plan to remain at [place] until the end of April of 2018.  Client is trying to plan where he and Hope will reside after the end of April 2018.  He meets as scheduled with his probation officer. He is trying to attend scheduled client medical appointments. He does not mention any pain issues. CSW encouraged client to call CSW at [phone] as needed to discuss social work needs of  client.   Plan:  Client to communicate with CSW in next 30 days to discuss housing resources for client in the area.  CSW to call client in 4 weeks to assess client needs.  Norva Riffle.Savanna Dooley MSW, LCSW Licensed Clinical Social Worker [facility] Care Management [phone]

## 2016-11-14 ENCOUNTER — Ambulatory Visit: Payer: Medicare Other | Admitting: Pharmacist

## 2016-11-15 ENCOUNTER — Encounter: Payer: Self-pay | Admitting: Family

## 2016-11-23 ENCOUNTER — Ambulatory Visit (INDEPENDENT_AMBULATORY_CARE_PROVIDER_SITE_OTHER): Payer: Medicare Other | Admitting: Family

## 2016-11-23 ENCOUNTER — Encounter: Payer: Self-pay | Admitting: Family

## 2016-11-23 VITALS — BP 122/80 | HR 99 | Temp 97.8°F | Ht 67.0 in | Wt 248.8 lb

## 2016-11-23 DIAGNOSIS — E785 Hyperlipidemia, unspecified: Secondary | ICD-10-CM

## 2016-11-23 DIAGNOSIS — E559 Vitamin D deficiency, unspecified: Secondary | ICD-10-CM | POA: Diagnosis not present

## 2016-11-23 DIAGNOSIS — E119 Type 2 diabetes mellitus without complications: Secondary | ICD-10-CM | POA: Diagnosis not present

## 2016-11-23 DIAGNOSIS — F17200 Nicotine dependence, unspecified, uncomplicated: Secondary | ICD-10-CM | POA: Insufficient documentation

## 2016-11-23 DIAGNOSIS — E669 Obesity, unspecified: Secondary | ICD-10-CM

## 2016-11-23 LAB — BAYER DCA HB A1C WAIVED: HB A1C (BAYER DCA - WAIVED): >14 % — ABNORMAL HIGH (ref <7.0)

## 2016-11-23 MED ORDER — GLUCOSE BLOOD VI STRP: ORAL_STRIP | 12 refills | Status: DC

## 2016-11-23 NOTE — Patient Instructions (Signed)
Diabetes Mellitus and Food It is important for you to manage your blood sugar (glucose) level. Your blood glucose level can be greatly affected by what you eat. Eating healthier foods in the appropriate amounts throughout the day at about the same time each day will help you control your blood glucose level. It can also help slow or prevent worsening of your diabetes mellitus. Healthy eating may even help you improve the level of your blood pressure and reach or maintain a healthy weight. General recommendations for healthful eating and cooking habits include:  Eating meals and snacks regularly. Avoid going long periods of time without eating to lose weight.  Eating a diet that consists mainly of plant-based foods, such as fruits, vegetables, nuts, legumes, and whole grains.  Using low-heat cooking methods, such as baking, instead of high-heat cooking methods, such as deep frying.  Work with your dietitian to make sure you understand how to use the Nutrition Facts information on food labels. How can food affect me? Carbohydrates Carbohydrates affect your blood glucose level more than any other type of food. Your dietitian will help you determine how many carbohydrates to eat at each meal and teach you how to count carbohydrates. Counting carbohydrates is important to keep your blood glucose at a healthy level, especially if you are using insulin or taking certain medicines for diabetes mellitus. Alcohol Alcohol can cause sudden decreases in blood glucose (hypoglycemia), especially if you use insulin or take certain medicines for diabetes mellitus. Hypoglycemia can be a life-threatening condition. Symptoms of hypoglycemia (sleepiness, dizziness, and disorientation) are similar to symptoms of having too much alcohol. If your health care provider has given you approval to drink alcohol, do so in moderation and use the following guidelines:  Women should not have more than one drink per day, and men  should not have more than two drinks per day. One drink is equal to: ? 12 oz of beer. ? 5 oz of wine. ? 1 oz of hard liquor.  Do not drink on an empty stomach.  Keep yourself hydrated. Have water, diet soda, or unsweetened iced tea.  Regular soda, juice, and other mixers might contain a lot of carbohydrates and should be counted.  What foods are not recommended? As you make food choices, it is important to remember that all foods are not the same. Some foods have fewer nutrients per serving than other foods, even though they might have the same number of calories or carbohydrates. It is difficult to get your body what it needs when you eat foods with fewer nutrients. Examples of foods that you should avoid that are high in calories and carbohydrates but low in nutrients include:  Trans fats (most processed foods list trans fats on the Nutrition Facts label).  Regular soda.  Juice.  Candy.  Sweets, such as cake, pie, doughnuts, and cookies.  Fried foods.  What foods can I eat? Eat nutrient-rich foods, which will nourish your body and keep you healthy. The food you should eat also will depend on several factors, including:  The calories you need.  The medicines you take.  Your weight.  Your blood glucose level.  Your blood pressure level.  Your cholesterol level.  You should eat a variety of foods, including:  Protein. ? Lean cuts of meat. ? Proteins low in saturated fats, such as fish, egg whites, and beans. Avoid processed meats.  Fruits and vegetables. ? Fruits and vegetables that may help control blood glucose levels, such as apples,   mangoes, and yams.  Dairy products. ? Choose fat-free or low-fat dairy products, such as milk, yogurt, and cheese.  Grains, bread, pasta, and rice. ? Choose whole grain products, such as multigrain bread, whole oats, and brown rice. These foods may help control blood pressure.  Fats. ? Foods containing healthful fats, such as  nuts, avocado, olive oil, canola oil, and fish.  Does everyone with diabetes mellitus have the same meal plan? Because every person with diabetes mellitus is different, there is not one meal plan that works for everyone. It is very important that you meet with a dietitian who will help you create a meal plan that is just right for you. This information is not intended to replace advice given to you by your health care provider. Make sure you discuss any questions you have with your health care provider. Document Released: 04/12/2005 Document Revised: 12/22/2015 Document Reviewed: 06/12/2013 Elsevier Interactive Patient Education  2017 Elsevier Inc.  

## 2016-11-23 NOTE — Progress Notes (Signed)
   Subjective:    Patient ID: Phillip Barrett, male    DOB: [birth year], 41 y.o.   MRN: [id number]  Pt presents to the office today for chronic follow up.  Diabetes  He presents for his follow-up diabetic visit. He has type 2 diabetes mellitus. There are no hypoglycemic associated symptoms. Pertinent negatives for diabetes include no foot paresthesias and no visual change. Symptoms are stable. Risk factors for coronary artery disease include diabetes mellitus, dyslipidemia, male sex, hypertension, post-menopausal, sedentary lifestyle and tobacco exposure. He is compliant with treatment most of the time. He is following a generally unhealthy diet. Eye exam is current.  Hyperlipidemia  This is a chronic problem. The current episode started more than 1 year ago. The problem is uncontrolled. Recent lipid tests were reviewed and are high. Exacerbating diseases include obesity. Current antihyperlipidemic treatment includes statins. The current treatment provides moderate improvement of lipids.      Review of Systems  All other systems reviewed and are negative.      Objective:   Physical Exam  Constitutional: He is oriented to person, place, and time. He appears well-developed and well-nourished. No distress.  HENT:  Head: Normocephalic.  Right Ear: External ear normal.  Left Ear: External ear normal.  Nose: Nose normal.  Mouth/Throat: Oropharynx is clear and moist.  Eyes: Pupils are equal, round, and reactive to light. Right eye exhibits no discharge. Left eye exhibits no discharge.  Neck: Normal range of motion. Neck supple. No thyromegaly present.  Cardiovascular: Normal rate, regular rhythm, normal heart sounds and intact distal pulses.   No murmur heard. Pulmonary/Chest: Effort normal and breath sounds normal. No respiratory distress. He has no wheezes.  Abdominal: Soft. Bowel sounds are normal. He exhibits no distension. There is no tenderness.  Musculoskeletal: Normal range of motion. He  exhibits no edema or tenderness.  Neurological: He is alert and oriented to person, place, and time.  Skin: Skin is warm and dry. No rash noted. No erythema.  Psychiatric: He has a normal mood and affect. His behavior is normal. Judgment and thought content normal.  Vitals reviewed.    BP 122/80   Pulse 99   Temp 97.8 F (36.6 C) (Oral)   Ht '5\' 7"'$  (1.702 m)   Wt 248 lb 12.8 oz (112.9 kg)   BMI 38.97 kg/m       Assessment & Plan:  1. Type 2 diabetes mellitus not at goal [facility]) - CMP14+EGFR - Microalbumin / creatinine urine ratio - Bayer DCA Hb A1c Waived - glucose blood test strip; Use as instructed  Dispense: 100 each; Refill: 12  2. Hyperlipidemia, unspecified hyperlipidemia type - CMP14+EGFR - Lipid panel  3. Obesity (BMI 30-39.9) - CMP14+EGFR  4. Vitamin D deficiency - CMP14+EGFR  5. Current smoker  - CMP14+EGFR   Continue all meds Labs pending Health Maintenance reviewed Diet and exercise encouraged RTO 4 months   Evelina Dun, FNP

## 2016-11-24 LAB — MICROALBUMIN / CREATININE URINE RATIO
Creatinine, Urine: 50.9 mg/dL
MICROALB/CREAT RATIO: 22.4 mg/g{creat} (ref 0.0–30.0)
Microalbumin, Urine: 11.4 ug/mL

## 2016-11-24 LAB — CMP14+EGFR
A/G RATIO: 1.3 (ref 1.2–2.2)
ALK PHOS: 93 IU/L (ref 39–117)
ALT: 13 IU/L (ref 0–44)
AST: 13 IU/L (ref 0–40)
Albumin: 4.5 g/dL (ref 3.5–5.5)
BILIRUBIN TOTAL: 0.2 mg/dL (ref 0.0–1.2)
BUN/Creatinine Ratio: 12 (ref 9–20)
BUN: 10 mg/dL (ref 6–24)
CO2: 23 mmol/L (ref 18–29)
Calcium: 9.7 mg/dL (ref 8.7–10.2)
Chloride: 96 mmol/L (ref 96–106)
Creatinine, Ser: 0.84 mg/dL (ref 0.76–1.27)
GFR calc Af Amer: 126 mL/min/{1.73_m2} (ref >59)
GFR, EST NON AFRICAN AMERICAN: 109 mL/min/{1.73_m2} (ref >59)
Globulin, Total: 3.4 g/dL (ref 1.5–4.5)
Glucose: 376 mg/dL — ABNORMAL HIGH (ref 65–99)
POTASSIUM: 4.8 mmol/L (ref 3.5–5.2)
SODIUM: 138 mmol/L (ref 134–144)
Total Protein: 7.9 g/dL (ref 6.0–8.5)

## 2016-11-24 LAB — LIPID PANEL
CHOL/HDL RATIO: 4 ratio (ref 0.0–5.0)
CHOLESTEROL TOTAL: 171 mg/dL (ref 100–199)
HDL: 43 mg/dL (ref >39)
LDL Calculated: 91 mg/dL (ref 0–99)
TRIGLYCERIDES: 185 mg/dL — AB (ref 0–149)
VLDL Cholesterol Cal: 37 mg/dL (ref 5–40)

## 2016-11-28 ENCOUNTER — Other Ambulatory Visit: Payer: Self-pay | Admitting: Family

## 2016-11-28 MED ORDER — METFORMIN HCL 1000 MG PO TABS: 1000.0000 mg | ORAL_TABLET | Freq: Two times a day (BID) | ORAL | 1 refills | Status: DC

## 2016-11-28 MED ORDER — SITAGLIPTIN PHOSPHATE 100 MG PO TABS: 100.0000 mg | ORAL_TABLET | Freq: Every day | ORAL | 1 refills | Status: DC

## 2016-11-30 DIAGNOSIS — L729 Follicular cyst of the skin and subcutaneous tissue, unspecified: Secondary | ICD-10-CM | POA: Diagnosis not present

## 2016-11-30 DIAGNOSIS — L089 Local infection of the skin and subcutaneous tissue, unspecified: Secondary | ICD-10-CM | POA: Diagnosis not present

## 2016-12-06 ENCOUNTER — Encounter: Payer: Self-pay | Admitting: Pharmacist

## 2016-12-06 ENCOUNTER — Ambulatory Visit (INDEPENDENT_AMBULATORY_CARE_PROVIDER_SITE_OTHER): Payer: Medicare Other | Admitting: Pharmacist

## 2016-12-06 VITALS — BP 112/70 | HR 82 | Ht 67.0 in | Wt 254.0 lb

## 2016-12-06 DIAGNOSIS — E119 Type 2 diabetes mellitus without complications: Secondary | ICD-10-CM

## 2016-12-06 DIAGNOSIS — L02212 Cutaneous abscess of back [any part, except buttock]: Secondary | ICD-10-CM | POA: Diagnosis not present

## 2016-12-06 MED ORDER — INSULIN LISPRO 100 UNIT/ML (KWIKPEN)
PEN_INJECTOR | SUBCUTANEOUS | 0 refills | Status: DC
Start: 2016-12-06 — End: 2019-11-04

## 2016-12-06 NOTE — Patient Instructions (Signed)
I have called [facility] and they will supply you with a new meter, strips and lancets at no cost.  They also have metformin and Januvia fill for you.  Continue metformin 1000mg  take 1 tablet twice a day with food Start Januvia 100mg  take 1 tablet each morning.   If your blood glucose if over 300 you can use Humalog Insulin - inject 5 units and then recheck blood glucose in 30 to 60 minutes.  If blood glucose is not decreasing then call office.  Also if you are using Humalog more than twice per week call office.

## 2016-12-06 NOTE — Progress Notes (Signed)
Patient ID: Phillip Barrett, male   DOB: [birth year], 41 y.o.   MRN: [id number]    Subjective:    Phillip Barrett is a 41 y.o. male who presents for uncontrolled Type 2 diabetes mellitus.  I last saw patient February 2018 and A1c was poorly controlled with an A1c over 14%. His most recently A1c which was still over 14% continues to show poorly controlled DM.  He is taking metformin 1000mg  bid.  He has not picked up Januvia 100mg  yet.    Phillip Barrett's home life is still in flux. He has moved back into [facility] and his father is paying $250/week for him to remain there.  Phillip Barrett is trying to find a more affordable option and has applied to live at [facility].  He does not have a stable income nor is he receiving monthly disability payment.  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  He has been referred to [facility] and a social worker if contacting patient regularly to give assistance. He has not restarted Januvia because he did not know it had been called to [facility].   Phillip Barrett has not been checking BG because he still does not have his old glucometer and he has run out of strips from the glucometer that I gave him at the last visit.    Diet: Patient goes to [employer] in [place] for 1 meal per day and is getting food assistance from area churches. He is not able to follow CHO counting diet always because he eats what is available to him.    Known diabetic complications: none Cardiovascular risk factors: diabetes mellitus, dyslipidemia, male gender, obesity (BMI >= 30 kg/m2), sedentary lifestyle and smoking/ tobacco exposure Eye exam current (within one year): yes Weight trend: increased by 5# Prior visit with CDE: yes  Is He on ACE inhibitor or angiotensin II receptor blocker?  No  microalbumin WNL 08/24/2016     Objective:    BP 112/70   Pulse 82   Ht 5\' 7"  (1.702 m)   Wt 254 lb (115.2 kg)   BMI 39.78 kg/m     A1c = over 14% (11/23/2016)  RBG = 322 Rechecked in 20 minutes after giving 8 units of Humalog and RBG was 310  Lab Review Glucose (mg/dL)  Date Value  11/23/2016 376 (H)  08/24/2016 210 (H)  05/04/2016 476 (>)   Creatinine, Ser (mg/dL)  Date Value  11/23/2016 0.84  08/24/2016 0.75 (L)  05/04/2016 0.82   Lipid Panel     Component Value Date/Time   CHOL 171 11/23/2016 1003   TRIG 185 (H) 11/23/2016 1003   HDL 43 11/23/2016 1003   CHOLHDL 4.0 11/23/2016 1003   LDLCALC 91 11/23/2016 1003     Assessment:    Diabetes Mellitus type 2- under inadequate control.   Food insecurity and other social complications contributing to poorly controlled DM   Plan:    1.  Rx changes: continue Metformin 1000mg  bid.     Patient given #28 samples of  Januvia 100mg  to take 1 daily.  He is also reminded that he has a Rx at [place] he can pick up   Patient was given 8u of Humalog in office - sample of Humalog pen given to patient and he is to give 5 units of BG if over 300.  He is to recheck BG in 30 to 60 minutes  and if has not decreased then he has been instruction to call our office.  He is also to call office if he uses Humalog more than 2 times in a week. 2.  Education: Reviewed 'ABCs' of diabetes management (respective goals in parentheses):  A1C (<7), blood pressure (<130/80), and cholesterol (LDL <100). 3.  Recommend check BG 2  times a day.  Spoke with [facility] and they are getting a free glucometer, strip and lancet for patient.  He is to go pick up today and start checking BG.  4.  Follow up: 1 month

## 2016-12-10 ENCOUNTER — Other Ambulatory Visit: Payer: Self-pay | Admitting: Licensed Clinical Social Worker

## 2016-12-10 NOTE — Patient Outreach (Signed)
Assessment:  CSW spoke via phone with client. CSW verified client identity. CSW received verbal permission form client on 12/10/16 for CSW to speak with client about current client needs and status.  Client sees Christy A. Hawks, Family Nurse Practitioner, as medical provider. Client has prescribed medications and is taking medications as prescribed. CSW and client spoke of client care plan. CSW encouraged client to communicate with CSW in the next 30 days to discuss community resources for housing assistance for client. Client said he has gone previously to [employer] shelter to seek food assistance. Client said he does not have a car and is dependent on family or friends to transport him as needed to medical appointments or to complete errands needed. Client said he did sometimes use [place] to transport client to and from client's scheduled medical appointments. Client said he continues to have financial challenges. He has struggled in recent months to maintain a stable residence. Client had been residing for a number of weeks at a shelter in [place], [place].  That housing ended recently. Client and his girlfriend, [facility], now reside at the [employer] in [place], [place].  Client said that his father sometimes helps client financially with rent costs of client. Client is meeting as scheduled with probation officer. Client did not mention any pain issues. Client is trying to attend scheduled client medical appointments. CSW encouraged client to call CSW at [phone] as needed to discuss social work needs of client. Client was appreciative of call from [place] on 12/10/16.   Plan:  Client to communicate with CSW in the next 30 days to discuss community resources for housing assistance for client.  CSW to call client in 4 weeks to assess client needs at that time.  Norva Riffle.Olivette Beckmann MSW, LCSW Licensed Clinical Social Worker [facility] Care Management [phone]

## 2017-01-08 ENCOUNTER — Ambulatory Visit: Payer: Self-pay | Admitting: Pharmacist

## 2017-01-10 ENCOUNTER — Encounter: Payer: Self-pay | Admitting: Family

## 2017-01-10 ENCOUNTER — Other Ambulatory Visit: Payer: Self-pay | Admitting: Licensed Clinical Social Worker

## 2017-01-10 NOTE — Patient Outreach (Signed)
Assessment:  CSW spoke via phone with client. CSW verified client identity. CSW received verbal permission from client on 01/10/17 for CSW to communicate with client about current client needs and status. Client sees Evelina Dun, Family Nurse Practitioner, as medical provider. Client said he had his prescribed medications and is taking medications as prescribed. CSW and client spoke of client care plan. CSW encouraged client to communicate with CSW in next 30 days to discuss community resources for housing assistance for client.  Client has gone previously to [facility] [facility] to seek food assistance. Client said he does not have a car and thus is dependent on family or friends to transport him to scheduled medical appointments or to complete community errands needed. Client said he did sometimes use [facility] to transport him to and from scheduled medical appointments. Client has struggled in recent months to maintain a stable residence.  Client and his girlfriend, [facility], have been residing recently at the [employer] in [place], [place]. Client said that his father occasionally helps client  with rent costs for client.  Client is meeting as scheduled with probation officer Client had meeting today with probation officer.Client did not mention any pain issues. Client is trying to attend scheduled client medical appointments. Client has communicated with Tessie Fass, pharmacist, at [facility]. Tammy arranged for client to receive a new glucometer and test strips from [employer]. CSW gave client [place] phone number of [phone] and encouraged client to call CSW to discuss social work needs of client.    Plan:  Client to communicate with CSW in next 30 days to discuss community resources for housing assistance for client.  CSW to call client in 4 weeks to assess client needs at that time.  Norva Riffle.Dalaina Tates MSW, LCSW Licensed  Clinical Social Worker [facility] Care Management [phone]

## 2017-02-12 ENCOUNTER — Other Ambulatory Visit: Payer: Self-pay | Admitting: Licensed Clinical Social Worker

## 2017-02-12 NOTE — Patient Outreach (Signed)
Assessment:  CSW spoke via phone with [facility], girlfriend of client, on 02/12/17. CSW verified identity of [facility]. Client has given CSW verbal permission for CSW to communicate with client or [facility] about current needs and status of client .   Client had been residing at [employer] in [place]. Client has financial challenges and had said he sometimes receives some financial help periodically from his father. Client sees Glory Buff, Family Nurse Practitioner, as medical provider. Client has had his prescribed medications and had been taking medications as prescribed.  CSW had encouraged client previously to communicate with CSW in next 30 days to discuss community resources for housing assistance for client. Client had said previously he did not have a car. Thus, he has been dependent on family or friends to transport him to client medical appointments and to help client complete errands needed. Client had sometimes used [facility] [place] to transport him to and from his scheduled medical appointments. Client had been meeting as scheduled with Engineer, manufacturing systems. Client had received a new glucometer and test strips a few weeks ago from [facility].  [facility], girlfriend of client, reported to [place] on 02/12/17 that client failed to appear for scheduled court date for client. Client is now in jail. Client has been in jail since January 23, 2017. Hope Settle said new court date for client is set for March 06, 2017.  She said she is planning to stay at [facility] until next Tuesday and will have to find a new residence after Tuesday where she can reside. She and client had been residing at [facility] in [place], [place].  [place] and [place] spoke of visitation schedule for client. Hope said she can visit client at local jail on Saturday afternoons. CSW gave [facility] CSW number of [phone] and encouraged that client or Hope to call CSW to discuss current client status. Again, Hope said client  has court hearing on 03/06/17.  CSW thanked [place] for phone call with CSW to discuss client's current status.    Plan:  Client or Hope Settle to communicate with CSW in next 30 days to discuss community resources for housing assistance for client.  CSW to call client or [facility]  in 3 weeks to assess client needs at that time.  Norva Riffle.Valen Mascaro MSW, LCSW Licensed Clinical Social Worker [facility] Care Management [phone]

## 2017-03-01 ENCOUNTER — Other Ambulatory Visit: Payer: Self-pay | Admitting: Family

## 2017-03-01 DIAGNOSIS — E785 Hyperlipidemia, unspecified: Secondary | ICD-10-CM

## 2017-03-06 ENCOUNTER — Other Ambulatory Visit: Payer: Self-pay | Admitting: Licensed Clinical Social Worker

## 2017-03-06 NOTE — Patient Outreach (Signed)
Assessment:  CSW spoke via phone with [facility], girlfriend of client. CSW verified identity of [facility]. Client has previously given verbal permission to [place] for CSW to speak with client or [facility] regarding client needs and status.  Hope and [place] spoke of client status and needs. Hope reported that client was released from jail recenlty and is residing in [place], [place].  Client is trying to attend scheduled client medical appointments. Client has prescribed medications and is taking medications as prescribed. Client sees Evelina Dun, Family Nurse Practitioner, as medical provider.  Client has financial challenges. Client has previously reported that he sometimes receives some financial help from his father as needed. Regarding client care plan, CSW has encouraged client previously for client to communicate with CSW in next 30 days to discuss community resources for housing assistance for client. Client does not have a car. Client has previously said that he sometimes uses [place] to transport client to and from client medical appointments.  Client has resided in several housing locations during the past year. CSW thanked [place] for call with CSW on 03/06/17.  CSW encouraged client or [place] to call CSW at [phone] as needed to discuss social work needs of client.   Plan:   Client to communicate with CSW in next 30 days to discuss community resources for housing assistance for client. .  CSW to call client in 4 weeks to assess client needs at that time.  Norva Riffle.Avonlea Sima MSW, LCSW Licensed Clinical Social Worker [facility] Care Management [phone]

## 2017-03-26 ENCOUNTER — Ambulatory Visit: Payer: Medicare Other | Admitting: Family

## 2017-03-28 ENCOUNTER — Encounter: Payer: Self-pay | Admitting: Family

## 2017-04-09 ENCOUNTER — Other Ambulatory Visit: Payer: Self-pay | Admitting: Licensed Clinical Social Worker

## 2017-04-09 NOTE — Patient Outreach (Signed)
Assessment:  CSW spoke via phone with [facility], girlfriend of client. CSW verified identity of [facility]. Client has given CSW permission previously for CSW to speak with client or [facility] regarding needs and status of client. Hope had reported previously to [place] that client had been released from jail and was residing in [place], [place]. She said client had prescribed medications and was taking medications as prescribed.  She said  that client sees Evelina Dun, [facility] Nurse Practitioner, as scheduled for medical care for client. Client does not have a car. He has said that he relies on friends to help transport him to needed appointments. He has also said previously that he sometimes uses [place] to transport him to and from his scheduled medical appointments. Client has lived in several residences in [place], [place] in the past year. He has financial challenges. Sometimes he receives some financial help from his father.  Regarding client care plan, CSW has encouraged client  to communicate with CSW in next 30 days to discuss community resources for housing assistance for client.  CSW has encouraged client or [place] to call CSW at [phone] as needed to discuss social work needs of client.     Plan:  Client to communicate with CSW in next 30 days to discuss community resources for housing options for client in the area.   CSW to call client or [facility] in 4 weeks to assess client needs at that time.  Norva Riffle.Shamicka Inga MSW, LCSW Licensed Clinical Social Worker [facility] Care Management [phone]

## 2017-04-25 ENCOUNTER — Encounter: Payer: Self-pay | Admitting: Licensed Clinical Social Worker

## 2017-04-25 ENCOUNTER — Other Ambulatory Visit: Payer: Self-pay | Admitting: Licensed Clinical Social Worker

## 2017-04-25 NOTE — Patient Outreach (Signed)
Assessment:  CSW spoke via phone with [facility]. CSW verified identity of [facility]. Hope and [place] spoke of client status and needs. Hope reported to [place] that client was now in jail (incarcerated).  She said client was convicted of a crime and will likely be in jail for 5 or 6 months (per [facility]).  Hope said she is now residing with a friend. Archit Leger is incarcerated. She said she does speak periodically via phone with Phillip Barrett. CSW informed Hope Settle on 04/25/17 that since client was incarcerated, CSW would discharge client on 04/25/17 from [place] services. Hope said she would relate this information via phone to client when she speaks with client.  She said she speaks via phone with client several times each week. CSW has talked with client previously about [facility] program services in nursing, social work and pharmacy. CSW thanked [place] for phone call with CSW on 04/25/17. CSW reminded Hope that when client is released from jail, he could talk again with Phillip Barrett, Family Nurse Practitioner regarding [facility] program services support.    Plan:  CSW is discharging Phillip Barrett from [facility] CSW services on 04/25/17 since client is now in jail (incarcerated).  CSW to inform Phillip Barrett, Case Management Assistant, that [place] discharged Phillip Barrett on 04/25/17 from [place] services.  CSW to fax physician case closure letter to Phillip Barrett, Family Nurse Practitioner, informing her that Phillip Barrett discharged client on 04/25/17 from [place] services.  Norva Riffle.Norlene Lanes MSW, LCSW Licensed Clinical Social Worker [facility] Care Management [phone]

## 2017-05-08 ENCOUNTER — Ambulatory Visit: Payer: Self-pay | Admitting: Licensed Clinical Social Worker

## 2018-10-05 NOTE — Congregational Nurse Program (Signed)
Has history of diabetes and takes Metformin daily. Needs new prescriptione. Will make appointment with [facility] and info given regarding what he needed to take  BP 125/79,  P [place], [place] [facility], [phone]

## 2018-10-19 NOTE — Congregational Nurse Program (Signed)
No complaints or concerns. Stated he is looking for housing. Taking medications as prescribed. B P  150/84 P 105 Reviewed importance of taking meds As prescribed, eating a heart healthy diet and getting at least 30 minutes of exercise 4-5 times a week. Voiced understanding. Erma Heritage RN, [place] [phone]

## 2018-12-16 NOTE — Congregational Nurse Program (Signed)
Stated he has been out today looking for housing and employment No complaints of pain or discomfort B/P 134/83 P [address] RN, [facility], [phone].

## 2019-10-29 ENCOUNTER — Emergency Department (HOSPITAL_COMMUNITY): Payer: Medicare Other

## 2019-10-29 ENCOUNTER — Other Ambulatory Visit: Payer: Self-pay

## 2019-10-29 ENCOUNTER — Observation Stay (HOSPITAL_COMMUNITY): Payer: Medicare Other

## 2019-10-29 ENCOUNTER — Inpatient Hospital Stay (HOSPITAL_COMMUNITY)
Admission: EM | Admit: 2019-10-29 | Discharge: 2019-11-04 | DRG: 154 | Disposition: A | Discharge status: Home Health | Payer: Medicare Other | Attending: Internal Medicine | Admitting: Internal Medicine

## 2019-10-29 ENCOUNTER — Encounter (HOSPITAL_COMMUNITY): Payer: Self-pay

## 2019-10-29 DIAGNOSIS — W19XXXA Unspecified fall, initial encounter: Secondary | ICD-10-CM | POA: Diagnosis present

## 2019-10-29 DIAGNOSIS — F17200 Nicotine dependence, unspecified, uncomplicated: Secondary | ICD-10-CM | POA: Diagnosis present

## 2019-10-29 DIAGNOSIS — E669 Obesity, unspecified: Secondary | ICD-10-CM | POA: Diagnosis present

## 2019-10-29 DIAGNOSIS — G9608 Other cranial cerebrospinal fluid leak: Secondary | ICD-10-CM | POA: Diagnosis present

## 2019-10-29 DIAGNOSIS — R471 Dysarthria and anarthria: Secondary | ICD-10-CM | POA: Diagnosis present

## 2019-10-29 DIAGNOSIS — G629 Polyneuropathy, unspecified: Secondary | ICD-10-CM | POA: Diagnosis present

## 2019-10-29 DIAGNOSIS — Z6834 Body mass index (BMI) 34.0-34.9, adult: Secondary | ICD-10-CM

## 2019-10-29 DIAGNOSIS — R410 Disorientation, unspecified: Secondary | ICD-10-CM | POA: Diagnosis present

## 2019-10-29 DIAGNOSIS — I10 Essential (primary) hypertension: Secondary | ICD-10-CM | POA: Diagnosis present

## 2019-10-29 DIAGNOSIS — G9341 Metabolic encephalopathy: Secondary | ICD-10-CM | POA: Diagnosis present

## 2019-10-29 DIAGNOSIS — S0990XA Unspecified injury of head, initial encounter: Secondary | ICD-10-CM

## 2019-10-29 DIAGNOSIS — E538 Deficiency of other specified B group vitamins: Secondary | ICD-10-CM | POA: Diagnosis present

## 2019-10-29 DIAGNOSIS — R4189 Other symptoms and signs involving cognitive functions and awareness: Secondary | ICD-10-CM | POA: Diagnosis present

## 2019-10-29 DIAGNOSIS — Z20822 Contact with and (suspected) exposure to covid-19: Secondary | ICD-10-CM | POA: Diagnosis present

## 2019-10-29 DIAGNOSIS — E119 Type 2 diabetes mellitus without complications: Secondary | ICD-10-CM

## 2019-10-29 DIAGNOSIS — Y92009 Unspecified place in unspecified non-institutional (private) residence as the place of occurrence of the external cause: Secondary | ICD-10-CM

## 2019-10-29 DIAGNOSIS — E785 Hyperlipidemia, unspecified: Secondary | ICD-10-CM | POA: Diagnosis present

## 2019-10-29 DIAGNOSIS — Z833 Family history of diabetes mellitus: Secondary | ICD-10-CM

## 2019-10-29 DIAGNOSIS — G119 Hereditary ataxia, unspecified: Secondary | ICD-10-CM | POA: Diagnosis present

## 2019-10-29 DIAGNOSIS — Z7982 Long term (current) use of aspirin: Secondary | ICD-10-CM

## 2019-10-29 DIAGNOSIS — Z79899 Other long term (current) drug therapy: Secondary | ICD-10-CM

## 2019-10-29 DIAGNOSIS — F1910 Other psychoactive substance abuse, uncomplicated: Secondary | ICD-10-CM | POA: Diagnosis not present

## 2019-10-29 DIAGNOSIS — F1410 Cocaine abuse, uncomplicated: Secondary | ICD-10-CM | POA: Diagnosis present

## 2019-10-29 DIAGNOSIS — R296 Repeated falls: Secondary | ICD-10-CM | POA: Diagnosis present

## 2019-10-29 DIAGNOSIS — Z23 Encounter for immunization: Secondary | ICD-10-CM

## 2019-10-29 DIAGNOSIS — Y900 Blood alcohol level of less than 20 mg/100 ml: Secondary | ICD-10-CM | POA: Diagnosis present

## 2019-10-29 DIAGNOSIS — F1026 Alcohol dependence with alcohol-induced persisting amnestic disorder: Secondary | ICD-10-CM | POA: Diagnosis present

## 2019-10-29 DIAGNOSIS — Z9114 Patient's other noncompliance with medication regimen: Secondary | ICD-10-CM

## 2019-10-29 DIAGNOSIS — Z9181 History of falling: Secondary | ICD-10-CM

## 2019-10-29 DIAGNOSIS — E11649 Type 2 diabetes mellitus with hypoglycemia without coma: Secondary | ICD-10-CM | POA: Diagnosis present

## 2019-10-29 DIAGNOSIS — F1210 Cannabis abuse, uncomplicated: Secondary | ICD-10-CM | POA: Diagnosis present

## 2019-10-29 DIAGNOSIS — F17210 Nicotine dependence, cigarettes, uncomplicated: Secondary | ICD-10-CM | POA: Diagnosis present

## 2019-10-29 DIAGNOSIS — F0781 Postconcussional syndrome: Secondary | ICD-10-CM | POA: Diagnosis present

## 2019-10-29 DIAGNOSIS — Z8249 Family history of ischemic heart disease and other diseases of the circulatory system: Secondary | ICD-10-CM

## 2019-10-29 DIAGNOSIS — Z794 Long term (current) use of insulin: Secondary | ICD-10-CM

## 2019-10-29 DIAGNOSIS — E1165 Type 2 diabetes mellitus with hyperglycemia: Secondary | ICD-10-CM | POA: Diagnosis present

## 2019-10-29 DIAGNOSIS — R233 Spontaneous ecchymoses: Secondary | ICD-10-CM | POA: Diagnosis present

## 2019-10-29 DIAGNOSIS — S022XXA Fracture of nasal bones, initial encounter for closed fracture: Secondary | ICD-10-CM | POA: Diagnosis not present

## 2019-10-29 LAB — COMPREHENSIVE METABOLIC PANEL
ALT: 20 U/L (ref 0–44)
AST: 25 U/L (ref 15–41)
Albumin: 4.1 g/dL (ref 3.5–5.0)
Alkaline Phosphatase: 61 U/L (ref 38–126)
Anion gap: 13 (ref 5–15)
BUN: 11 mg/dL (ref 6–20)
CO2: 25 mmol/L (ref 22–32)
Calcium: 9.3 mg/dL (ref 8.9–10.3)
Chloride: 101 mmol/L (ref 98–111)
Creatinine, Ser: 0.9 mg/dL (ref 0.61–1.24)
GFR calc Af Amer: >60 mL/min (ref >60)
GFR calc non Af Amer: >60 mL/min (ref >60)
Glucose, Bld: 162 mg/dL — ABNORMAL HIGH (ref 70–99)
Potassium: 3.7 mmol/L (ref 3.5–5.1)
Sodium: 139 mmol/L (ref 135–145)
Total Bilirubin: 1 mg/dL (ref 0.3–1.2)
Total Protein: 8.9 g/dL — ABNORMAL HIGH (ref 6.5–8.1)

## 2019-10-29 LAB — CBG MONITORING, ED
Glucose-Capillary: 159 mg/dL — ABNORMAL HIGH (ref 70–99)
Glucose-Capillary: 182 mg/dL — ABNORMAL HIGH (ref 70–99)

## 2019-10-29 LAB — CBC WITH DIFFERENTIAL/PLATELET
Abs Immature Granulocytes: 0.03 10*3/uL (ref 0.00–0.07)
Basophils Absolute: 0.1 10*3/uL (ref 0.0–0.1)
Basophils Relative: 0 %
Eosinophils Absolute: 0 10*3/uL (ref 0.0–0.5)
Eosinophils Relative: 0 %
HCT: 46.1 % (ref 39.0–52.0)
Hemoglobin: 14.9 g/dL (ref 13.0–17.0)
Immature Granulocytes: 0 %
Lymphocytes Relative: 13 %
Lymphs Abs: 1.6 10*3/uL (ref 0.7–4.0)
MCH: 28.3 pg (ref 26.0–34.0)
MCHC: 32.3 g/dL (ref 30.0–36.0)
MCV: 87.5 fL (ref 80.0–100.0)
Monocytes Absolute: 1.1 10*3/uL — ABNORMAL HIGH (ref 0.1–1.0)
Monocytes Relative: 9 %
Neutro Abs: 9.6 10*3/uL — ABNORMAL HIGH (ref 1.7–7.7)
Neutrophils Relative %: 78 %
Platelets: 372 10*3/uL (ref 150–400)
RBC: 5.27 MIL/uL (ref 4.22–5.81)
RDW: 14.8 % (ref 11.5–15.5)
WBC: 12.4 10*3/uL — ABNORMAL HIGH (ref 4.0–10.5)
nRBC: 0 % (ref 0.0–0.2)

## 2019-10-29 LAB — URINALYSIS, ROUTINE W REFLEX MICROSCOPIC
Bacteria, UA: NONE SEEN
Bilirubin Urine: NEGATIVE
Glucose, UA: NEGATIVE mg/dL
Ketones, ur: 20 mg/dL — AB
Leukocytes,Ua: NEGATIVE
Nitrite: NEGATIVE
Protein, ur: 30 mg/dL — AB
Specific Gravity, Urine: 1.029 (ref 1.005–1.030)
pH: 5 (ref 5.0–8.0)

## 2019-10-29 LAB — RAPID URINE DRUG SCREEN, HOSP PERFORMED
Amphetamines: NOT DETECTED
Barbiturates: NOT DETECTED
Benzodiazepines: NOT DETECTED
Cocaine: POSITIVE — AB
Opiates: NOT DETECTED
Tetrahydrocannabinol: POSITIVE — AB

## 2019-10-29 LAB — AMMONIA: Ammonia: 21 umol/L (ref 9–35)

## 2019-10-29 LAB — LACTIC ACID, PLASMA: Lactic Acid, Venous: 1.1 mmol/L (ref 0.5–1.9)

## 2019-10-29 LAB — ETHANOL: Alcohol, Ethyl (B): <10 mg/dL (ref <10)

## 2019-10-29 MED ORDER — SIMVASTATIN 20 MG PO TABS
20.0000 mg | ORAL_TABLET | Freq: Every day | ORAL | Status: DC
  Administered 2019-10-29: 20 mg via ORAL
  Filled 2019-10-29: qty 1

## 2019-10-29 MED ORDER — SODIUM CHLORIDE 0.9 % IV SOLN: 250.0000 mL | INTRAVENOUS | Status: DC | PRN

## 2019-10-29 MED ORDER — ASPIRIN EC 81 MG PO TBEC
81.0000 mg | DELAYED_RELEASE_TABLET | Freq: Every day | ORAL | Status: DC
  Administered 2019-10-30 – 2019-11-04 (×4): 81 mg via ORAL
  Filled 2019-10-29 (×7): qty 1

## 2019-10-29 MED ORDER — INSULIN ASPART 100 UNIT/ML ~~LOC~~ SOLN
0.0000 [IU] | Freq: Three times a day (TID) | SUBCUTANEOUS | Status: DC
  Administered 2019-10-30: 1 [IU] via SUBCUTANEOUS
  Administered 2019-10-30: 2 [IU] via SUBCUTANEOUS
  Administered 2019-11-02 (×2): 1 [IU] via SUBCUTANEOUS
  Administered 2019-11-02: 2 [IU] via SUBCUTANEOUS
  Administered 2019-11-03 (×2): 1 [IU] via SUBCUTANEOUS
  Administered 2019-11-03 – 2019-11-04 (×2): 2 [IU] via SUBCUTANEOUS
  Administered 2019-11-04: 1 [IU] via SUBCUTANEOUS

## 2019-10-29 MED ORDER — FOLIC ACID 1 MG PO TABS
1.0000 mg | ORAL_TABLET | Freq: Every day | ORAL | Status: DC
  Administered 2019-10-29 – 2019-11-04 (×6): 1 mg via ORAL
  Filled 2019-10-29 (×7): qty 1

## 2019-10-29 MED ORDER — SODIUM CHLORIDE 0.9% FLUSH
3.0000 mL | Freq: Two times a day (BID) | INTRAVENOUS | Status: DC
  Administered 2019-10-29 – 2019-11-04 (×4): 3 mL via INTRAVENOUS

## 2019-10-29 MED ORDER — HEPARIN SODIUM (PORCINE) 5000 UNIT/ML IJ SOLN: 5000.0000 [IU] | Freq: Three times a day (TID) | INTRAMUSCULAR | Status: DC

## 2019-10-29 MED ORDER — LORAZEPAM 2 MG/ML IJ SOLN
0.0000 mg | Freq: Four times a day (QID) | INTRAMUSCULAR | Status: DC
  Administered 2019-10-30: 2 mg via INTRAVENOUS
  Administered 2019-10-31: 1 mg via INTRAVENOUS
  Filled 2019-10-29 (×3): qty 1

## 2019-10-29 MED ORDER — THIAMINE HCL 100 MG PO TABS
100.0000 mg | ORAL_TABLET | Freq: Every day | ORAL | Status: DC
  Administered 2019-10-29 – 2019-11-04 (×6): 100 mg via ORAL
  Filled 2019-10-29 (×7): qty 1

## 2019-10-29 MED ORDER — SODIUM CHLORIDE 0.9 % IV SOLN: INTRAVENOUS | Status: DC

## 2019-10-29 MED ORDER — ADULT MULTIVITAMIN W/MINERALS CH
1.0000 | ORAL_TABLET | Freq: Every day | ORAL | Status: DC
  Administered 2019-10-29 – 2019-10-30 (×2): 1 via ORAL
  Filled 2019-10-29 (×2): qty 1

## 2019-10-29 MED ORDER — LINAGLIPTIN 5 MG PO TABS
5.0000 mg | ORAL_TABLET | Freq: Every day | ORAL | Status: DC
  Administered 2019-10-30: 5 mg via ORAL
  Filled 2019-10-29: qty 1

## 2019-10-29 MED ORDER — TRAZODONE HCL 50 MG PO TABS
100.0000 mg | ORAL_TABLET | Freq: Every day | ORAL | Status: DC
  Administered 2019-10-29 – 2019-11-01 (×4): 100 mg via ORAL
  Filled 2019-10-29 (×4): qty 2

## 2019-10-29 MED ORDER — THIAMINE HCL 100 MG/ML IJ SOLN
100.0000 mg | Freq: Every day | INTRAMUSCULAR | Status: DC
  Filled 2019-10-29: qty 2

## 2019-10-29 MED ORDER — LORAZEPAM 1 MG PO TABS: 1.0000 mg | ORAL_TABLET | ORAL | Status: DC | PRN

## 2019-10-29 MED ORDER — LORAZEPAM 2 MG/ML IJ SOLN
1.0000 mg | INTRAMUSCULAR | Status: DC | PRN
  Administered 2019-10-31: 1 mg via INTRAVENOUS
  Filled 2019-10-29: qty 1

## 2019-10-29 MED ORDER — ONDANSETRON HCL 4 MG/2ML IJ SOLN: 4.0000 mg | Freq: Four times a day (QID) | INTRAMUSCULAR | Status: DC | PRN

## 2019-10-29 MED ORDER — ADULT MULTIVITAMIN W/MINERALS CH
1.0000 | ORAL_TABLET | Freq: Every day | ORAL | Status: DC
  Administered 2019-11-01 – 2019-11-04 (×4): 1 via ORAL
  Filled 2019-10-29 (×5): qty 1

## 2019-10-29 MED ORDER — METFORMIN HCL 500 MG PO TABS
1000.0000 mg | ORAL_TABLET | Freq: Two times a day (BID) | ORAL | Status: DC
  Administered 2019-10-30: 1000 mg via ORAL
  Filled 2019-10-29: qty 2

## 2019-10-29 MED ORDER — SODIUM CHLORIDE 0.9% FLUSH: 3.0000 mL | INTRAVENOUS | Status: DC | PRN

## 2019-10-29 MED ORDER — ACETAMINOPHEN 325 MG PO TABS
650.0000 mg | ORAL_TABLET | Freq: Four times a day (QID) | ORAL | Status: DC | PRN
  Administered 2019-10-29 – 2019-11-02 (×4): 650 mg via ORAL
  Filled 2019-10-29 (×4): qty 2

## 2019-10-29 MED ORDER — ACETAMINOPHEN 650 MG RE SUPP: 650.0000 mg | Freq: Four times a day (QID) | RECTAL | Status: DC | PRN

## 2019-10-29 MED ORDER — NICOTINE 14 MG/24HR TD PT24
14.0000 mg | MEDICATED_PATCH | Freq: Every day | TRANSDERMAL | Status: DC
  Administered 2019-10-30 – 2019-11-04 (×7): 14 mg via TRANSDERMAL
  Filled 2019-10-29 (×7): qty 1

## 2019-10-29 MED ORDER — INFLUENZA VAC SPLIT QUAD 0.5 ML IM SUSY
0.5000 mL | PREFILLED_SYRINGE | INTRAMUSCULAR | Status: DC
  Filled 2019-10-29: qty 0.5

## 2019-10-29 MED ORDER — ONDANSETRON HCL 4 MG PO TABS: 4.0000 mg | ORAL_TABLET | Freq: Four times a day (QID) | ORAL | Status: DC | PRN

## 2019-10-29 MED ORDER — PNEUMOCOCCAL VAC POLYVALENT 25 MCG/0.5ML IJ INJ
0.5000 mL | INJECTION | INTRAMUSCULAR | Status: AC
  Administered 2019-11-04: 0.5 mL via INTRAMUSCULAR
  Filled 2019-10-29: qty 0.5

## 2019-10-29 MED ORDER — LORAZEPAM 2 MG/ML IJ SOLN: 0.0000 mg | Freq: Two times a day (BID) | INTRAMUSCULAR | Status: DC

## 2019-10-29 MED ORDER — INSULIN ASPART 100 UNIT/ML ~~LOC~~ SOLN
0.0000 [IU] | Freq: Every day | SUBCUTANEOUS | Status: DC
  Administered 2019-10-30 – 2019-11-02 (×2): 2 [IU] via SUBCUTANEOUS

## 2019-10-29 MED ORDER — POLYETHYLENE GLYCOL 3350 17 G PO PACK
17.0000 g | PACK | Freq: Every day | ORAL | Status: DC
  Administered 2019-10-30 – 2019-11-04 (×4): 17 g via ORAL
  Filled 2019-10-29 (×6): qty 1

## 2019-10-29 NOTE — ED Notes (Signed)
Pt in mri 

## 2019-10-29 NOTE — ED Notes (Signed)
Pt in bed, pt denies pain, pt oriented to person, place and day of the week, informed pt that Marcie Bal called, states that this is his girlfriend.  Pt awaits dispo
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
## 2019-10-29 NOTE — ED Notes (Signed)
Reviewed d/c instructions with pt, Explored transportation home, contacted pt's girlfriend Marcie Bal, states that she will call back if she can come get him.

## 2019-10-29 NOTE — ED Notes (Addendum)
Pt back from MRI, per MRI, pt pulled out his IV, pt no longer has iv in place in R ac, dressing placed.  MD Notified of pts arrival back in ed, bed in low position, bed rails up, pads on floor, pt has no requests, call bell within reach.

## 2019-10-29 NOTE — Discharge Instructions (Addendum)
Thank you for allowing me to care for you today. Please return to the emergency department if you have new or worsening symptoms.   
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
## 2019-10-29 NOTE — ED Notes (Signed)
Pt sitting on the edge of the bed, states that he wants to go home, states that he is no longer confused.  Provider at bedside.  Pt able to stand and is steady on feet, pt to be dc
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
## 2019-10-30 ENCOUNTER — Inpatient Hospital Stay (HOSPITAL_COMMUNITY): Payer: Medicare Other

## 2019-10-30 ENCOUNTER — Other Ambulatory Visit: Payer: Self-pay | Admitting: Family

## 2019-10-30 DIAGNOSIS — G9341 Metabolic encephalopathy: Secondary | ICD-10-CM | POA: Diagnosis present

## 2019-10-30 DIAGNOSIS — E538 Deficiency of other specified B group vitamins: Secondary | ICD-10-CM | POA: Diagnosis present

## 2019-10-30 DIAGNOSIS — S022XXA Fracture of nasal bones, initial encounter for closed fracture: Principal | ICD-10-CM

## 2019-10-30 DIAGNOSIS — Z20822 Contact with and (suspected) exposure to covid-19: Secondary | ICD-10-CM | POA: Diagnosis present

## 2019-10-30 DIAGNOSIS — F17210 Nicotine dependence, cigarettes, uncomplicated: Secondary | ICD-10-CM | POA: Diagnosis present

## 2019-10-30 DIAGNOSIS — Z9114 Patient's other noncompliance with medication regimen: Secondary | ICD-10-CM | POA: Diagnosis not present

## 2019-10-30 DIAGNOSIS — R4189 Other symptoms and signs involving cognitive functions and awareness: Secondary | ICD-10-CM | POA: Diagnosis present

## 2019-10-30 DIAGNOSIS — R296 Repeated falls: Secondary | ICD-10-CM | POA: Diagnosis present

## 2019-10-30 DIAGNOSIS — Y92009 Unspecified place in unspecified non-institutional (private) residence as the place of occurrence of the external cause: Secondary | ICD-10-CM | POA: Diagnosis not present

## 2019-10-30 DIAGNOSIS — E119 Type 2 diabetes mellitus without complications: Secondary | ICD-10-CM

## 2019-10-30 DIAGNOSIS — I10 Essential (primary) hypertension: Secondary | ICD-10-CM | POA: Diagnosis present

## 2019-10-30 DIAGNOSIS — F17200 Nicotine dependence, unspecified, uncomplicated: Secondary | ICD-10-CM

## 2019-10-30 DIAGNOSIS — R410 Disorientation, unspecified: Secondary | ICD-10-CM | POA: Diagnosis present

## 2019-10-30 DIAGNOSIS — R233 Spontaneous ecchymoses: Secondary | ICD-10-CM | POA: Diagnosis present

## 2019-10-30 DIAGNOSIS — G629 Polyneuropathy, unspecified: Secondary | ICD-10-CM | POA: Diagnosis present

## 2019-10-30 DIAGNOSIS — W19XXXD Unspecified fall, subsequent encounter: Secondary | ICD-10-CM

## 2019-10-30 DIAGNOSIS — W19XXXA Unspecified fall, initial encounter: Secondary | ICD-10-CM | POA: Diagnosis present

## 2019-10-30 DIAGNOSIS — F1910 Other psychoactive substance abuse, uncomplicated: Secondary | ICD-10-CM

## 2019-10-30 DIAGNOSIS — F1026 Alcohol dependence with alcohol-induced persisting amnestic disorder: Secondary | ICD-10-CM | POA: Diagnosis present

## 2019-10-30 DIAGNOSIS — Z833 Family history of diabetes mellitus: Secondary | ICD-10-CM | POA: Diagnosis not present

## 2019-10-30 DIAGNOSIS — S0990XA Unspecified injury of head, initial encounter: Secondary | ICD-10-CM

## 2019-10-30 DIAGNOSIS — G119 Hereditary ataxia, unspecified: Secondary | ICD-10-CM | POA: Diagnosis present

## 2019-10-30 DIAGNOSIS — E1165 Type 2 diabetes mellitus with hyperglycemia: Secondary | ICD-10-CM | POA: Diagnosis present

## 2019-10-30 DIAGNOSIS — G9608 Other cranial cerebrospinal fluid leak: Secondary | ICD-10-CM | POA: Diagnosis present

## 2019-10-30 DIAGNOSIS — F1410 Cocaine abuse, uncomplicated: Secondary | ICD-10-CM | POA: Diagnosis present

## 2019-10-30 DIAGNOSIS — Y900 Blood alcohol level of less than 20 mg/100 ml: Secondary | ICD-10-CM | POA: Diagnosis present

## 2019-10-30 DIAGNOSIS — Z8249 Family history of ischemic heart disease and other diseases of the circulatory system: Secondary | ICD-10-CM | POA: Diagnosis not present

## 2019-10-30 DIAGNOSIS — E669 Obesity, unspecified: Secondary | ICD-10-CM | POA: Diagnosis present

## 2019-10-30 DIAGNOSIS — F0781 Postconcussional syndrome: Secondary | ICD-10-CM | POA: Diagnosis present

## 2019-10-30 DIAGNOSIS — E785 Hyperlipidemia, unspecified: Secondary | ICD-10-CM | POA: Diagnosis present

## 2019-10-30 DIAGNOSIS — Z6834 Body mass index (BMI) 34.0-34.9, adult: Secondary | ICD-10-CM | POA: Diagnosis not present

## 2019-10-30 DIAGNOSIS — Z23 Encounter for immunization: Secondary | ICD-10-CM | POA: Diagnosis not present

## 2019-10-30 DIAGNOSIS — E11649 Type 2 diabetes mellitus with hypoglycemia without coma: Secondary | ICD-10-CM | POA: Diagnosis present

## 2019-10-30 LAB — CBC
HCT: 42.3 % (ref 39.0–52.0)
Hemoglobin: 13.8 g/dL (ref 13.0–17.0)
MCH: 28.5 pg (ref 26.0–34.0)
MCHC: 32.6 g/dL (ref 30.0–36.0)
MCV: 87.4 fL (ref 80.0–100.0)
Platelets: 341 10*3/uL (ref 150–400)
RBC: 4.84 MIL/uL (ref 4.22–5.81)
RDW: 14.6 % (ref 11.5–15.5)
WBC: 9.4 10*3/uL (ref 4.0–10.5)
nRBC: 0 % (ref 0.0–0.2)

## 2019-10-30 LAB — TSH: TSH: 0.949 u[IU]/mL (ref 0.350–4.500)

## 2019-10-30 LAB — COMPREHENSIVE METABOLIC PANEL
ALT: 16 U/L (ref 0–44)
AST: 19 U/L (ref 15–41)
Albumin: 3.5 g/dL (ref 3.5–5.0)
Alkaline Phosphatase: 52 U/L (ref 38–126)
Anion gap: 9 (ref 5–15)
BUN: 11 mg/dL (ref 6–20)
CO2: 25 mmol/L (ref 22–32)
Calcium: 8.9 mg/dL (ref 8.9–10.3)
Chloride: 107 mmol/L (ref 98–111)
Creatinine, Ser: 0.84 mg/dL (ref 0.61–1.24)
GFR calc Af Amer: >60 mL/min (ref >60)
GFR calc non Af Amer: >60 mL/min (ref >60)
Glucose, Bld: 136 mg/dL — ABNORMAL HIGH (ref 70–99)
Potassium: 3.7 mmol/L (ref 3.5–5.1)
Sodium: 141 mmol/L (ref 135–145)
Total Bilirubin: 1.2 mg/dL (ref 0.3–1.2)
Total Protein: 7.5 g/dL (ref 6.5–8.1)

## 2019-10-30 LAB — GLUCOSE, CAPILLARY
Glucose-Capillary: 123 mg/dL — ABNORMAL HIGH (ref 70–99)
Glucose-Capillary: 153 mg/dL — ABNORMAL HIGH (ref 70–99)
Glucose-Capillary: 172 mg/dL — ABNORMAL HIGH (ref 70–99)
Glucose-Capillary: 210 mg/dL — ABNORMAL HIGH (ref 70–99)
Glucose-Capillary: 234 mg/dL — ABNORMAL HIGH (ref 70–99)

## 2019-10-30 LAB — VITAMIN B12: Vitamin B-12: 293 pg/mL (ref 180–914)

## 2019-10-30 LAB — CK: Total CK: 195 U/L (ref 49–397)

## 2019-10-30 LAB — HIV ANTIBODY (ROUTINE TESTING W REFLEX): HIV Screen 4th Generation wRfx: NONREACTIVE

## 2019-10-30 LAB — SARS CORONAVIRUS 2 (TAT 6-24 HRS): SARS Coronavirus 2: NEGATIVE

## 2019-10-30 LAB — HEMOGLOBIN A1C
Hgb A1c MFr Bld: 9 % — ABNORMAL HIGH (ref 4.8–5.6)
Mean Plasma Glucose: 211.6 mg/dL

## 2019-10-30 LAB — VITAMIN D 25 HYDROXY (VIT D DEFICIENCY, FRACTURES): Vit D, 25-Hydroxy: 22.22 ng/mL — ABNORMAL LOW (ref 30–100)

## 2019-10-30 MED ORDER — CYANOCOBALAMIN 1000 MCG/ML IJ SOLN
1000.0000 ug | Freq: Once | INTRAMUSCULAR | Status: AC
  Administered 2019-10-30: 1000 ug via INTRAMUSCULAR
  Filled 2019-10-30: qty 1

## 2019-10-30 MED ORDER — INSULIN DETEMIR 100 UNIT/ML ~~LOC~~ SOLN
12.0000 [IU] | Freq: Every day | SUBCUTANEOUS | Status: DC
  Administered 2019-10-30 – 2019-11-03 (×5): 12 [IU] via SUBCUTANEOUS
  Filled 2019-10-30 (×6): qty 0.12

## 2019-10-30 NOTE — Plan of Care (Signed)
  Problem: Acute Rehab PT Goals(only PT should resolve) Goal: Pt Will Go Supine/Side To Sit Outcome: Progressing Flowsheets (Taken 10/30/2019 0901) Pt will go Supine/Side to Sit: with modified independence Goal: Pt Will Go Sit To Supine/Side Outcome: Progressing Flowsheets (Taken 10/30/2019 0901) Pt will go Sit to Supine/Side: with modified independence Goal: Patient Will Perform Sitting Balance Outcome: Progressing Flowsheets (Taken 10/30/2019 0901) Patient will perform sitting balance: with modified independence Goal: Patient Will Transfer Sit To/From Stand Outcome: Progressing Flowsheets (Taken 10/30/2019 0901) Patient will transfer sit to/from stand: with modified independence Goal: Pt Will Transfer Bed To Chair/Chair To Bed Outcome: Progressing Flowsheets (Taken 10/30/2019 0901) Pt will Transfer Bed to Chair/Chair to Bed: with modified independence Goal: Pt Will Ambulate Outcome: Progressing Flowsheets (Taken 10/30/2019 0901) Pt will Ambulate:  > 125 feet  with modified independence  9:02 AM, 10/30/19 Mearl Latin PT, DPT Physical Therapist at [facility]

## 2019-10-30 NOTE — Care Management Obs Status (Signed)
[facility] NOTIFICATION   Patient Details  Name: Phillip Barrett MRN: [id number] Date of Birth: 02-14-1976   Medicare Observation Status Notification Given:  Yes    [place], LCSW 10/30/2019, 2:19 PM
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
## 2019-10-30 NOTE — Plan of Care (Signed)
  Problem: Acute Rehab OT Goals (only OT should resolve) Goal: Pt. Will Perform Eating Flowsheets (Taken 10/30/2019 1110) Pt Will Perform Eating:  with modified independence  sitting Goal: Pt. Will Perform Grooming Flowsheets (Taken 10/30/2019 1110) Pt Will Perform Grooming:  with supervision  standing Goal: Pt. Will Perform Upper Body Dressing Flowsheets (Taken 10/30/2019 1110) Pt Will Perform Upper Body Dressing:  with modified independence  sitting Goal: Pt. Will Perform Lower Body Dressing Flowsheets (Taken 10/30/2019 1110) Pt Will Perform Lower Body Dressing:  with supervision  sitting/lateral leans  sit to/from stand Goal: Pt. Will Transfer To Toilet Flowsheets (Taken 10/30/2019 1110) Pt Will Transfer to Toilet:  with supervision  ambulating  regular height toilet Goal: Pt. Will Perform Toileting-Clothing Manipulation Flowsheets (Taken 10/30/2019 1110) Pt Will Perform Toileting - Clothing Manipulation and hygiene:  with supervision  sitting/lateral leans  sit to/from stand
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
## 2019-10-30 NOTE — Consult Note (Signed)
Connell A. Merlene Laughter, MD     [email or web address]          Phillip Barrett is an 44 y.o. male.   ASSESSMENT/PLAN: 1. SUBACUTE GAIT IMPAIRMENT WITH RECURRENT FALLS:  THERE IS NO SINGLE DOMINANT ETIOLOGY ALTHOUGH NEUROPATHY IS CLEARLY PRESENT.  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 CAUSE OF NEUROPATHY INCLUDES ALCOHOLISM AND POORLY CONTROLLED DIABETES MELLITUS TYPE 2. THERE IS SOME SUGGESTION THAT Phillip  Barrett COULD HAVE POSTURAL ORTHOSTATIC TACHYCARDIA SYNDROME WHICH IF PRESENT IS UNDOUBTEDLY ALSO DUE TO POLYNEUROPATHY.   Barrett will need physical and occupational therapies. Nutritional replacement including time is recommended. He also has mildly low vitamin B12 and this will also be replaced as Vitamin B12 deficiency can be associated with neuropathy.  Cervical spine MRI is recommended to evaluate for potential cervical myelopathy. 2. 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 Alcohol cessation is recommended. Continue with thiamine and multivitamin replacement. 3. Vitamin B12 deficiency replacement as suggested above 4. Poly substance drug abuse    Phillip Barrett is a 44 year old black male who presents with about a month history of recurrent falls. Phillip Barrett seems to have limited insight and cannot provide adequate history. He  Was being evaluated in Phillip emergency room for these recurrent episodes of falling and gait impairment when on Phillip time to discharge Phillip Barrett is fell sustaining injuries.  Again, Phillip history is significantly limited but Phillip Barrett does not report clear focal deficit. He denies dysphagia or dysarthria. He does not report loss of consciousness or convulsions. He does report having some dizziness on standing. Three-view systems is limited but otherwise unrevealing.      GENERAL:  Phillip Barrett appears about 57 years older than Phillip stated age. He is relatively unkept. He has Phillip  stigmata of chronic substance abuse.  HEENT:  Extensive facial injuries with swelling of Phillip lower lip and Phillip left upper facial region.  ABDOMEN: soft  EXTREMITIES: No edema   BACK:  Normal  SKIN: Normal by inspection.    MENTAL STATUS:  He is awake and alert. He does cooperate with Phillip evaluation.  Speech is mildly dysarthric. He follows commands well. Again he has very limited insight into his overall well-being.  CRANIAL NERVES: Pupils are equal, round and reactive to light and accomodation; extra ocular movements are full, there is no significant nystagmus; visual fields are full; upper and lower facial muscles are normal in strength and symmetric, there is no flattening of Phillip nasolabial folds; tongue is midline; uvula is midline; shoulder elevation is normal.  MOTOR:  Tone and bulk are normal throughout. Strength is graded as 4/5 in Phillip upper and lower extremities proximally and distally.  COORDINATION: Left finger to nose is normal, right finger to nose is normal, No rest tremor; no intention tremor; no postural tremor; no bradykinesia.  REFLEXES: Deep tendon reflexes are symmetrical and normal in Phillip upper extremities but significantly diminished in Phillip legs 1+. Plantar reflexes are flexor bilaterally.   SENSATION: Normal to pain.  GAIT:  Marked truncal ataxia and Phillip unsteadiness.    Blood pressure (!) 142/75, pulse 81, temperature 97.8 F (36.6 C), temperature source Oral, resp. rate 18, height 5' 8"  (1.727 m), weight 102.2 kg, SpO2 98 %.  Past Medical History:  Diagnosis Date  . Diabetes mellitus without complication ([place])   . Hyperlipidemia   . Hypertension     History reviewed. No pertinent surgical history.  Family History  Problem Relation Age of  Onset  . Diabetes Mother   . Diabetes Father   . Hypertension Father   . Diabetes Brother     Social History:  reports that he has been smoking cigarettes. He has been smoking about 1.00 pack per day. He uses  smokeless tobacco. He reports current alcohol use of about 2.0 standard drinks of alcohol per week. He reports current drug use. Drug: Cocaine.  Allergies: No Known Allergies  Medications: Prior to Admission medications   Medication Sig Start Date End Date Taking? Authorizing Provider  blood glucose meter kit and supplies KIT Dispense based on Barrett and insurance preference. Use up to two times daily as directed. (FOR uncontrolled type 2 DM E 11.9) 09/05/16  Yes Dettinger, Fransisca Kaufmann, MD  glucose blood test strip Use as instructed 11/23/16  Yes Hawks, Christy A, FNP  metFORMIN (GLUCOPHAGE) 1000 MG tablet Take 1 tablet (1,000 mg total) by mouth 2 (two) times daily with a meal. 11/28/16  Yes Hawks, Christy A, FNP  simvastatin (ZOCOR) 20 MG tablet TAKE 1 TABLET BY MOUTH AT BEDTIME Barrett taking differently: Take 20 mg by mouth at bedtime.  05/28/16  Yes Hawks, Christy A, FNP  insulin lispro (HUMALOG) 100 UNIT/ML KiwkPen Inject 5 units for blood glucose over 300.  Call office if you are using more than twice in a week Barrett not taking: Reported on 12/06/2016 12/06/16   Cherre Robins, PharmD    Scheduled Meds: . aspirin EC  81 mg Oral Q breakfast  . folic acid  1 mg Oral Daily  . influenza vac split quadrivalent PF  0.5 mL Intramuscular Tomorrow-1000  . insulin aspart  0-5 Units Subcutaneous QHS  . insulin aspart  0-6 Units Subcutaneous TID WC  . insulin detemir  12 Units Subcutaneous QHS  . LORazepam  0-4 mg Intravenous Q6H   Followed by  . [START ON 10/31/2019] LORazepam  0-4 mg Intravenous Q12H  . multivitamin with minerals  1 tablet Oral Daily  . nicotine  14 mg Transdermal Daily  . pneumococcal 23 valent vaccine  0.5 mL Intramuscular Tomorrow-1000  . polyethylene glycol  17 g Oral Daily  . sodium chloride flush  3 mL Intravenous Q12H  . thiamine  100 mg Oral Daily   Or  . thiamine  100 mg Intravenous Daily  . traZODone  100 mg Oral QHS   Continuous Infusions: . sodium chloride    .  sodium chloride 100 mL/hr at 10/30/19 1541   PRN Meds:.sodium chloride, acetaminophen **OR** acetaminophen, LORazepam **OR** LORazepam, ondansetron **OR** ondansetron (ZOFRAN) IV, sodium chloride flush     Results for orders placed or performed during Phillip hospital encounter of 10/29/19 (from Phillip past 48 hour(s))  Vitamin B12     Status: None   Collection Time: 10/29/19  7:25 AM  Result Value Ref Range   Vitamin B-12 293 180 - 914 pg/mL    Comment: (NOTE) This assay is not validated for testing neonatal or myeloproliferative syndrome specimens for Vitamin B12 levels. Performed at [facility], [address] [place], [place] [address]   TSH     Status: None   Collection Time: 10/29/19  7:25 AM  Result Value Ref Range   TSH 0.949 0.350 - 4.500 uIU/mL    Comment: Performed by a 3rd Generation assay with a functional sensitivity of <=0.01 uIU/mL. Performed at [facility], [address] [place], [place] [address]   CBC with Differential     Status: Abnormal   Collection Time: 10/29/19  1:39 PM  Result Value Ref Range   WBC 12.4 (H) 4.0 - 10.5 K/uL   RBC 5.27 4.22 - 5.81 MIL/uL   Hemoglobin 14.9 13.0 - 17.0 g/dL   HCT 46.1 39.0 - 52.0 %   MCV 87.5 80.0 - 100.0 fL   MCH 28.3 26.0 - 34.0 pg   MCHC 32.3 30.0 - 36.0 g/dL   RDW 14.8 11.5 - 15.5 %   Platelets 372 150 - 400 K/uL   nRBC 0.0 0.0 - 0.2 %   Neutrophils Relative % 78 %   Neutro Abs 9.6 (H) 1.7 - 7.7 K/uL   Lymphocytes Relative 13 %   Lymphs Abs 1.6 0.7 - 4.0 K/uL   Monocytes Relative 9 %   Monocytes Absolute 1.1 (H) 0.1 - 1.0 K/uL   Eosinophils Relative 0 %   Eosinophils Absolute 0.0 0.0 - 0.5 K/uL   Basophils Relative 0 %   Basophils Absolute 0.1 0.0 - 0.1 K/uL   Immature Granulocytes 0 %   Abs Immature Granulocytes 0.03 0.00 - 0.07 K/uL    Comment: Performed at [facility], [address], [place], [place] [address]  Comprehensive metabolic panel     Status: Abnormal   Collection Time: 10/29/19  1:39 PM  Result  Value Ref Range   Sodium 139 135 - 145 mmol/L   Potassium 3.7 3.5 - 5.1 mmol/L   Chloride 101 98 - 111 mmol/L   CO2 25 22 - 32 mmol/L   Glucose, Bld 162 (H) 70 - 99 mg/dL    Comment: Glucose reference range applies only to samples taken after fasting for at least 8 hours.   BUN 11 6 - 20 mg/dL   Creatinine, Ser 0.90 0.61 - 1.24 mg/dL   Calcium 9.3 8.9 - 10.3 mg/dL   Total Protein 8.9 (H) 6.5 - 8.1 g/dL   Albumin 4.1 3.5 - 5.0 g/dL   AST 25 15 - 41 U/L   ALT 20 0 - 44 U/L   Alkaline Phosphatase 61 38 - 126 U/L   Total Bilirubin 1.0 0.3 - 1.2 mg/dL   GFR calc non Af Amer >60 >60 mL/min   GFR calc Af Amer >60 >60 mL/min   Anion gap 13 5 - 15    Comment: Performed at [facility], [address], [place], [place] [address]  Lactic acid, plasma     Status: None   Collection Time: 10/29/19  1:39 PM  Result Value Ref Range   Lactic Acid, Venous 1.1 0.5 - 1.9 mmol/L    Comment: Performed at [facility], [address], [place], [place] [address]  Ethanol     Status: None   Collection Time: 10/29/19  1:39 PM  Result Value Ref Range   Alcohol, Ethyl (B) <10 <10 mg/dL    Comment: (NOTE) Lowest detectable limit for serum alcohol is 10 mg/dL. For medical purposes only. Performed at [facility], [address], [place],  [address]   Hemoglobin A1c     Status: Abnormal   Collection Time: 10/29/19  1:40 PM  Result Value Ref Range   Hgb A1c MFr Bld 9.0 (H) 4.8 - 5.6 %    Comment: (NOTE) Pre diabetes:          5.7%-6.4% Diabetes:              >6.4% Glycemic control for   <7.0% adults with diabetes    Mean Plasma Glucose 211.6 mg/dL    Comment: Performed at [facility], [address], [place], [place] [address]  Urinalysis, Routine w reflex microscopic     Status: Abnormal   Collection Time: 10/29/19  2:07 PM  Result Value Ref Range   Color, Urine YELLOW YELLOW   APPearance CLEAR CLEAR   Specific Gravity, Urine 1.029 1.005 - 1.030   pH 5.0 5.0 - 8.0   Glucose, UA  NEGATIVE NEGATIVE mg/dL   Hgb urine dipstick MODERATE (A) NEGATIVE   Bilirubin Urine NEGATIVE NEGATIVE   Ketones, ur 20 (A) NEGATIVE mg/dL   Protein, ur 30 (A) NEGATIVE mg/dL   Nitrite NEGATIVE NEGATIVE   Leukocytes,Ua NEGATIVE NEGATIVE   RBC / HPF 0-5 0 - 5 RBC/hpf   WBC, UA 0-5 0 - 5 WBC/hpf   Bacteria, UA NONE SEEN NONE SEEN   Squamous Epithelial / LPF 0-5 0 - 5   Mucus PRESENT     Comment: Performed at [facility], [address], [place], [place] [address]  Urine rapid drug screen (hosp performed)     Status: Abnormal   Collection Time: 10/29/19  2:07 PM  Result Value Ref Range   Opiates NONE DETECTED NONE DETECTED   Cocaine POSITIVE (A) NONE DETECTED   Benzodiazepines NONE DETECTED NONE DETECTED   Amphetamines NONE DETECTED NONE DETECTED   Tetrahydrocannabinol POSITIVE (A) NONE DETECTED   Barbiturates NONE DETECTED NONE DETECTED    Comment: (NOTE) DRUG SCREEN FOR MEDICAL PURPOSES ONLY.  IF CONFIRMATION IS NEEDED FOR ANY PURPOSE, NOTIFY LAB WITHIN 5 DAYS. LOWEST DETECTABLE LIMITS FOR URINE DRUG SCREEN Drug Class                     Cutoff (ng/mL) Amphetamine and metabolites    1000 Barbiturate and metabolites    200 Benzodiazepine                 628 Tricyclics and metabolites     300 Opiates and metabolites        300 Cocaine and metabolites        300 THC                            50 Performed at [facility], [address], [place], [place] [address]   CBG monitoring, ED     Status: Abnormal   Collection Time: 10/29/19  2:11 PM  Result Value Ref Range   Glucose-Capillary 159 (H) 70 - 99 mg/dL    Comment: Glucose reference range applies only to samples taken after fasting for at least 8 hours.  CBG monitoring, ED     Status: Abnormal   Collection Time: 10/29/19  2:21 PM  Result Value Ref Range   Glucose-Capillary 182 (H) 70 - 99 mg/dL    Comment: Glucose reference range applies only to samples taken after fasting for at least 8 hours.  Ammonia     Status: None    Collection Time: 10/29/19  4:09 PM  Result Value Ref Range   Ammonia 21 9 - 35 umol/L    Comment: Performed at [facility], [address] [place], [place] [address]  HIV Antibody (routine testing w rflx)     Status: None   Collection Time: 10/29/19  4:09 PM  Result Value Ref Range   HIV Screen 4th Generation wRfx NON REACTIVE NON REACTIVE    Comment: Performed at [facility], [place] [address], [place], [place] [address]  SARS CORONAVIRUS 2 (TAT 6-24 HRS) Nasopharyngeal  Nasopharyngeal Swab     Status: None   Collection Time: 10/29/19  7:30 PM   Specimen: Nasopharyngeal Swab  Result Value Ref Range   SARS Coronavirus 2 NEGATIVE NEGATIVE    Comment: (NOTE) SARS-CoV-2 target nucleic acids are NOT DETECTED. Phillip SARS-CoV-2 RNA is generally detectable in upper and lower respiratory specimens during Phillip acute phase of infection. Negative results do not preclude SARS-CoV-2 infection, do not rule out co-infections with other pathogens, and should not be used as Phillip sole basis for treatment or other Barrett management decisions. Negative results must be combined with clinical observations, Barrett history, and epidemiological information. Phillip expected result is Negative. Fact Sheet for Patients: SugarRoll.be Fact Sheet for Healthcare Providers: https://www.woods-mathews.com/ This test is not yet approved or cleared by [place] FDA and  has been authorized for detection and/or diagnosis of SARS-CoV-2 by FDA under an Emergency Use Authorization (EUA). This EUA will remain  in effect (meaning this test can be used) for Phillip duration of Phillip COVID-19 declaration under Section 56 4(b)(1) of Phillip Act, 21 U.S.C. section 360bbb-3(b)(1), unless Phillip authorization is terminated or revoked sooner. Performed at [facility], [place] [address], [place], [place] [address]   Glucose, capillary     Status: Abnormal   Collection Time: 10/30/19  12:07 AM  Result Value Ref Range   Glucose-Capillary 153 (H) 70 - 99 mg/dL    Comment: Glucose reference range applies only to samples taken after fasting for at least 8 hours.  CBC     Status: None   Collection Time: 10/30/19  5:37 AM  Result Value Ref Range   WBC 9.4 4.0 - 10.5 K/uL   RBC 4.84 4.22 - 5.81 MIL/uL   Hemoglobin 13.8 13.0 - 17.0 g/dL   HCT 42.3 39.0 - 52.0 %   MCV 87.4 80.0 - 100.0 fL   MCH 28.5 26.0 - 34.0 pg   MCHC 32.6 30.0 - 36.0 g/dL   RDW 14.6 11.5 - 15.5 %   Platelets 341 150 - 400 K/uL   nRBC 0.0 0.0 - 0.2 %    Comment: Performed at [facility], [address] [place], [place] [address]  Comprehensive metabolic panel     Status: Abnormal   Collection Time: 10/30/19  5:37 AM  Result Value Ref Range   Sodium 141 135 - 145 mmol/L   Potassium 3.7 3.5 - 5.1 mmol/L   Chloride 107 98 - 111 mmol/L   CO2 25 22 - 32 mmol/L   Glucose, Bld 136 (H) 70 - 99 mg/dL    Comment: Glucose reference range applies only to samples taken after fasting for at least 8 hours.   BUN 11 6 - 20 mg/dL   Creatinine, Ser 0.84 0.61 - 1.24 mg/dL   Calcium 8.9 8.9 - 10.3 mg/dL   Total Protein 7.5 6.5 - 8.1 g/dL   Albumin 3.5 3.5 - 5.0 g/dL   AST 19 15 - 41 U/L   ALT 16 0 - 44 U/L   Alkaline Phosphatase 52 38 - 126 U/L   Total Bilirubin 1.2 0.3 - 1.2 mg/dL   GFR calc non Af Amer >60 >60 mL/min   GFR calc Af Amer >60 >60 mL/min   Anion gap 9 5 - 15    Comment: Performed at [facility], [address], [place], [place] [address]  Glucose, capillary     Status: Abnormal   Collection Time: 10/30/19  7:44 AM  Result Value Ref Range  Glucose-Capillary 123 (H) 70 - 99 mg/dL    Comment: Glucose reference range applies only to samples taken after fasting for at least 8 hours.  Glucose, capillary     Status: Abnormal   Collection Time: 10/30/19 12:04 PM  Result Value Ref Range   Glucose-Capillary 234 (H) 70 - 99 mg/dL    Comment: Glucose reference range applies only to samples taken after  fasting for at least 8 hours.  CK     Status: None   Collection Time: 10/30/19  4:16 PM  Result Value Ref Range   Total CK 195 49 - 397 U/L    Comment: Performed at [facility], [address], [place], [place] [address]  Glucose, capillary     Status: Abnormal   Collection Time: 10/30/19  4:24 PM  Result Value Ref Range   Glucose-Capillary 172 (H) 70 - 99 mg/dL    Comment: Glucose reference range applies only to samples taken after fasting for at least 8 hours.    Studies/Results:  MRI HEAD WITHOUT CONTRAST  MRA HEAD WITHOUT CONTRAST  TECHNIQUE: Multiplanar, multiecho pulse sequences of Phillip brain and surrounding structures were obtained without intravenous contrast. Angiographic images of Phillip head were obtained using MRA technique without contrast.  COMPARISON:  Head CT same day  FINDINGS: MRI HEAD FINDINGS  Brain: Diffusion imaging shows restricted diffusion in Phillip splenium and posterior body of Phillip corpus callosum and in Phillip left cingulate gyrus. Findings probably relate to closed head injury. Susceptibility weighted imaging shows petechial blood products in these regions. No focal finding affects Phillip brainstem or cerebellum. Cerebral hemispheres otherwise show a non hemorrhagic hygroma on Phillip right with thickness of 4 mm. No mass lesion or hydrocephalus.  Vascular: Major vessels at Phillip base of Phillip brain show flow.  Skull and upper cervical spine: Negative  Sinuses/Orbits: Clear/normal  Other: None  MRA HEAD FINDINGS  Both internal carotid arteries are widely patent into Phillip brain. No siphon stenosis. Phillip anterior and middle cerebral vessels are patent without proximal stenosis, aneurysm or vascular malformation.  Both vertebral arteries are widely patent to Phillip basilar. No basilar stenosis. Posterior circulation branch vessels appear normal.  IMPRESSION: Restricted diffusion and petechial blood products in Phillip splenium and posterior body of  Phillip corpus callosum and Phillip adjacent left cingulate gyrus. 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  Negative intracranial MR angiography.      HEAD NECK   CT IMPRESSION: 1. No CT evidence for acute intracranial process. 2. Minimally depressed left nasal bone fracture. 3. Soft tissue swelling over Phillip right maxillary region with soft tissue hematoma measuring up to 2.5 cm. 4. No evidence of acute fracture or malalignment of Phillip cervical Spine.    MRI reviewed in person shows increase signal splenium CP with reduced signal bout same area of T2-STAR. FLAIR otherwise fine.   Alexzandrea Normington A. Merlene Laughter, M.D.  Diplomate, Tax adviser of Psychiatry and Neurology ( Neurology). 10/30/2019, 5:55 PM     \
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
## 2019-10-31 LAB — LIPID PANEL
Cholesterol: 137 mg/dL (ref 0–200)
HDL: 38 mg/dL — ABNORMAL LOW (ref >40)
LDL Cholesterol: 86 mg/dL (ref 0–99)
Total CHOL/HDL Ratio: 3.6 RATIO
Triglycerides: 63 mg/dL (ref <150)
VLDL: 13 mg/dL (ref 0–40)

## 2019-10-31 LAB — BLOOD GAS, ARTERIAL
Acid-Base Excess: 1.5 mmol/L (ref 0.0–2.0)
Bicarbonate: 25.7 mmol/L (ref 20.0–28.0)
FIO2: 21
O2 Saturation: 93.4 %
Patient temperature: 37.4
pCO2 arterial: 39.6 mmHg (ref 32.0–48.0)
pH, Arterial: 7.425 (ref 7.350–7.450)
pO2, Arterial: 69.9 mmHg — ABNORMAL LOW (ref 83.0–108.0)

## 2019-10-31 LAB — GLUCOSE, CAPILLARY
Glucose-Capillary: 135 mg/dL — ABNORMAL HIGH (ref 70–99)
Glucose-Capillary: 113 mg/dL — ABNORMAL HIGH (ref 70–99)
Glucose-Capillary: 101 mg/dL — ABNORMAL HIGH (ref 70–99)
Glucose-Capillary: 200 mg/dL — ABNORMAL HIGH (ref 70–99)

## 2019-10-31 MED ORDER — CHLORDIAZEPOXIDE HCL 5 MG PO CAPS
10.0000 mg | ORAL_CAPSULE | Freq: Three times a day (TID) | ORAL | Status: DC
  Administered 2019-10-31 – 2019-11-01 (×3): 10 mg via ORAL
  Filled 2019-10-31 (×3): qty 2

## 2019-10-31 NOTE — Progress Notes (Incomplete)
Very drowsy, opened eyes once this morning to name and squeezed hand weakly on command.  Unable to give po meds and not awake enough to eat.  Last ativan was one mg at around 0330.  Has had none on day shift.  Passed this information on to Dr. Dyann Kief  ?
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
## 2019-11-01 LAB — GLUCOSE, CAPILLARY
Glucose-Capillary: 148 mg/dL — ABNORMAL HIGH (ref 70–99)
Glucose-Capillary: 126 mg/dL — ABNORMAL HIGH (ref 70–99)
Glucose-Capillary: 96 mg/dL (ref 70–99)
Glucose-Capillary: 126 mg/dL — ABNORMAL HIGH (ref 70–99)

## 2019-11-01 MED ORDER — CHLORDIAZEPOXIDE HCL 5 MG PO CAPS
5.0000 mg | ORAL_CAPSULE | Freq: Three times a day (TID) | ORAL | Status: DC
  Administered 2019-11-01 – 2019-11-02 (×4): 5 mg via ORAL
  Filled 2019-11-01 (×4): qty 1
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
## 2019-11-02 LAB — GLUCOSE, CAPILLARY
Glucose-Capillary: 160 mg/dL — ABNORMAL HIGH (ref 70–99)
Glucose-Capillary: 218 mg/dL — ABNORMAL HIGH (ref 70–99)
Glucose-Capillary: 188 mg/dL — ABNORMAL HIGH (ref 70–99)
Glucose-Capillary: 240 mg/dL — ABNORMAL HIGH (ref 70–99)

## 2019-11-02 MED ORDER — TRAZODONE HCL 50 MG PO TABS
100.0000 mg | ORAL_TABLET | Freq: Every evening | ORAL | Status: DC | PRN
  Administered 2019-11-03: 100 mg via ORAL
  Filled 2019-11-02: qty 2

## 2019-11-02 MED ORDER — CYANOCOBALAMIN 1000 MCG/ML IJ SOLN
1000.0000 ug | Freq: Once | INTRAMUSCULAR | Status: AC
  Administered 2019-11-02: 1000 ug via INTRAMUSCULAR
  Filled 2019-11-02: qty 1

## 2019-11-02 NOTE — Progress Notes (Signed)
CSW at bedside to offer patient SNF as the recommendation by PT. Patient declined SNF recommendation and wishes to be discharged home.   Patient states that he lives with a friend Marcie Bal and is informing her that he is discharging home soon and to be present upon his arrival.   DME recommendations are 5in rolling walker.  [place] [facility]  Clinical Social Worker  Ph: [phone]
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
## 2019-11-02 NOTE — Progress Notes (Signed)
CSW in contact with Georgina Snell from [place] who accepted patient for PT services.   [place] [facility]  Clinical Social Worker  Ph: [phone]

## 2019-11-02 NOTE — Care Management Important Message (Signed)
Important Message  Patient Details  Name: Phillip Barrett MRN: [id number] Date of Birth: [birth year]   Medicare Important Message Given:  Yes     Tommy Medal 11/02/2019, 12:24 PM

## 2019-11-02 NOTE — Plan of Care (Signed)
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
## 2019-11-02 NOTE — Progress Notes (Signed)
OT Cancellation Note  Patient Details Name: DEMORRIS JANIS MRN: [id number] DOB: [birth year]   Cancelled Treatment:    Reason Eval/Treat Not Completed: Other (comment). Planned for a co-treat with PT this AM although OT was detained at start. When retrieving patient a mask, OT witnessed another patient falling in their room and provided assistance for nursing with this patient. Once OT returned to [place] room, provided PT assist as a stand by person during ambulation while educating patient on body posture during walker use. Did not provide enough skilled therapy to charge this date.    Ailene Ravel, OTR/L,CBIS  [phone]  11/02/2019, 11:21 AM

## 2019-11-03 DIAGNOSIS — F0781 Postconcussional syndrome: Secondary | ICD-10-CM

## 2019-11-03 DIAGNOSIS — S022XXA Fracture of nasal bones, initial encounter for closed fracture: Secondary | ICD-10-CM

## 2019-11-03 LAB — GLUCOSE, CAPILLARY
Glucose-Capillary: 154 mg/dL — ABNORMAL HIGH (ref 70–99)
Glucose-Capillary: 161 mg/dL — ABNORMAL HIGH (ref 70–99)
Glucose-Capillary: 212 mg/dL — ABNORMAL HIGH (ref 70–99)
Glucose-Capillary: 193 mg/dL — ABNORMAL HIGH (ref 70–99)

## 2019-11-03 MED ORDER — LORAZEPAM 1 MG PO TABS
1.0000 mg | ORAL_TABLET | ORAL | Status: DC | PRN
  Administered 2019-11-03: 1 mg via ORAL
  Filled 2019-11-03: qty 1

## 2019-11-03 MED ORDER — INSULIN DETEMIR 100 UNIT/ML FLEXPEN: 10.0000 [IU] | PEN_INJECTOR | Freq: Every day | SUBCUTANEOUS | 11 refills | Status: DC

## 2019-11-03 MED ORDER — CHLORDIAZEPOXIDE HCL 5 MG PO CAPS: ORAL_CAPSULE | ORAL | 0 refills | Status: DC

## 2019-11-03 MED ORDER — LORAZEPAM 2 MG/ML IJ SOLN
1.0000 mg | INTRAMUSCULAR | Status: DC | PRN
  Administered 2019-11-03: 04:00:00 1 mg via INTRAVENOUS
  Filled 2019-11-03: qty 1

## 2019-11-03 MED ORDER — NICOTINE 14 MG/24HR TD PT24: 14.0000 mg | MEDICATED_PATCH | Freq: Every day | TRANSDERMAL | 0 refills | Status: AC

## 2019-11-03 MED ORDER — ASPIRIN EC 81 MG PO TBEC: 81.0000 mg | DELAYED_RELEASE_TABLET | Freq: Every day | ORAL | 1 refills | Status: DC

## 2019-11-03 MED ORDER — "PEN NEEDLES 3/16"" 31G X 5 MM MISC": 1 refills | Status: DC

## 2019-11-03 MED ORDER — ADULT MULTIVITAMIN W/MINERALS CH: 1.0000 | ORAL_TABLET | Freq: Every day | ORAL | 1 refills | Status: AC

## 2019-11-03 NOTE — Progress Notes (Signed)
CSW met with patient and discussed substance abuse services with patient as patient is currently receiving counseling for substance use at [facility] located at [address], [place] [address]. Patient reported he has not been in contact with his therapist since his admission to the hospital as he left his cell phone at home. Patient gave CSW permission to leave a voicemail for his therapist, Garey Ham, at [phone] so that she would be aware of where he is and that he is okay. CSW left voicemail for patient's therapist.  Tobi Bastos, LCSW [facility] Worker [facility] Ph: [phone]

## 2019-11-03 NOTE — Progress Notes (Signed)
CSW provided Phillip Barrett with [facility] referral for 5 in rolling walker.   [facility]  Clinical Social Worker  Ph: [phone]

## 2019-11-03 NOTE — Progress Notes (Signed)
Ambulated patient in hall with front-wheel walker.  Patient struggled to make it to the nurses' station and back to his room.  About half way back to the room patient sat down in a wheel chair and stated he didn't feel well.  He was rolled in the [facility] back to his room.  Provider was notified.
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
## 2019-11-03 NOTE — Progress Notes (Signed)
Patient has shown increased agitation during shift.  Patient is very fixated on going home and walking to his apartment.  Have reoriented and talked with patient several times during the night.  Patient is alert and able to have conversation, but very forgetful and impulsive.

## 2019-11-03 NOTE — Care Management (Addendum)
Patient asking for a ride home. Discussed with nurse, she feels patient is not steady to ride in a cab .  Patient has medicaid, called to [facility], they can provide transportation but the cut off for transportation is 2 pm.   Left VM with Marcie Bal, friend listed on chart, asking for return call.   Patient states their is no one else who can provide transport.
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
## 2019-11-04 LAB — GLUCOSE, CAPILLARY
Glucose-Capillary: 156 mg/dL — ABNORMAL HIGH (ref 70–99)
Glucose-Capillary: 243 mg/dL — ABNORMAL HIGH (ref 70–99)

## 2019-11-04 NOTE — Care Management Important Message (Signed)
Important Message  Patient Details  Name: Phillip Barrett MRN: [id number] Date of Birth: Jun 15, 1976   Medicare Important Message Given:  Yes     Tommy Medal 11/04/2019, 11:32 AM

## 2019-11-04 NOTE — TOC Transition Note (Signed)
Transition of Care [facility]) - CM/SW Discharge Note   Patient Details  Name: Phillip Barrett MRN: [id number] Date of Birth: April 20, 1976  [facility] CM/SW Contact:  Kanden Carey, Chauncey Reading, RN Phone Number: 11/04/2019, 11:27 AM   Clinical Narrative:     [facility] to pick up patient at 1:30 today, will have lift Lucianne Lei for ease of patient to get on van with RW. $2 provided by [facility] department for transportation cost.    Final next level of care: [place] Barriers to Discharge: Barriers Resolved         Discharge Plan and Services   Discharge Planning Services: CM Consult, Other - See comment(money for [facility] transportation)                   Readmission Risk Interventions No flowsheet data found.

## 2019-11-04 NOTE — Progress Notes (Signed)
Discharge paperwork reviewed with patient, pt states understanding. Prescriptions called into [facility] by MD, pt states that he will call them and have them delivered to his house. Pt states he will call his pastor when he gets home and "set up with her to get me to my doctor's appointments."  Pt discharged via wheelchair to [place] with all listed belongings in his possession. Pt able to ambulate onto [place] with use of front wheeled walker and only standby assist from [employer] account planner and this nurse.

## 2019-11-04 NOTE — Progress Notes (Signed)
Pt assist ed up to bathroom, used front rolling walker with only standby assistance. Back to chair, denies c/o. Finishing breakfast tray at present. Alert, oriented, asking when he will get to go home. Advised pt that case management was working on arranging transportation and I would update him as soon as I had any information. Pt states, "OK."
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
## 2019-11-06 ENCOUNTER — Telehealth: Payer: Self-pay | Admitting: *Deleted

## 2019-11-06 NOTE — Telephone Encounter (Signed)
Attempted to contact the patient - NA

## 2019-11-06 NOTE — Telephone Encounter (Signed)
Vm from [place] [facility] w/ [facility] eval yesterday. Pt's resting HR 120, others vitals good, no distress Pt has not eaten in 2 days since discharge from hospital, maybe dehydrated, not drinking much either. Does not have vitamins or nicotine patch in home. He is going to get a glucometer and strips but has not checked his BS since he has gotten home either

## 2019-11-06 NOTE — Telephone Encounter (Signed)
If heart rate is still elevated and seems dehydrated he needs to go back to ED.

## 2019-11-09 NOTE — Telephone Encounter (Signed)
Please go to hospital if continuing to have any symptoms or problems. Message left on home number.  Call us for concerns.

## 2019-11-12 ENCOUNTER — Telehealth: Payer: Self-pay | Admitting: *Deleted

## 2019-11-12 NOTE — Telephone Encounter (Signed)
Left message for patient to call back. Patient is receiving home health services through [facility]. We have not seen patient in almost 3 years. Patient needs appointment to be seen.

## 2019-11-16 NOTE — Telephone Encounter (Signed)
lmtcb

## 2020-02-25 ENCOUNTER — Inpatient Hospital Stay (HOSPITAL_COMMUNITY)
Admission: EM | Admit: 2020-02-25 | Discharge: 2020-03-08 | DRG: 083 | Disposition: A | Discharge status: Rehabilitation Facility | Payer: Medicare Other | Attending: Surgery | Admitting: Surgery

## 2020-02-25 ENCOUNTER — Other Ambulatory Visit (HOSPITAL_COMMUNITY): Payer: Medicare Other

## 2020-02-25 ENCOUNTER — Other Ambulatory Visit: Payer: Self-pay

## 2020-02-25 ENCOUNTER — Other Ambulatory Visit (HOSPITAL_COMMUNITY): Payer: Self-pay | Admitting: Radiology

## 2020-02-25 ENCOUNTER — Emergency Department (HOSPITAL_COMMUNITY): Payer: Medicare Other

## 2020-02-25 DIAGNOSIS — Z20822 Contact with and (suspected) exposure to covid-19: Secondary | ICD-10-CM | POA: Diagnosis present

## 2020-02-25 DIAGNOSIS — Z7982 Long term (current) use of aspirin: Secondary | ICD-10-CM | POA: Diagnosis not present

## 2020-02-25 DIAGNOSIS — Z833 Family history of diabetes mellitus: Secondary | ICD-10-CM | POA: Diagnosis not present

## 2020-02-25 DIAGNOSIS — Y92039 Unspecified place in apartment as the place of occurrence of the external cause: Secondary | ICD-10-CM

## 2020-02-25 DIAGNOSIS — S066X9A Traumatic subarachnoid hemorrhage with loss of consciousness of unspecified duration, initial encounter: Secondary | ICD-10-CM | POA: Diagnosis present

## 2020-02-25 DIAGNOSIS — F1010 Alcohol abuse, uncomplicated: Secondary | ICD-10-CM | POA: Diagnosis present

## 2020-02-25 DIAGNOSIS — S0240CA Maxillary fracture, right side, initial encounter for closed fracture: Secondary | ICD-10-CM | POA: Diagnosis present

## 2020-02-25 DIAGNOSIS — S069X0S Unspecified intracranial injury without loss of consciousness, sequela: Secondary | ICD-10-CM | POA: Diagnosis not present

## 2020-02-25 DIAGNOSIS — S069X6D Unspecified intracranial injury with loss of consciousness greater than 24 hours without return to pre-existing conscious level with patient surviving, subsequent encounter: Secondary | ICD-10-CM | POA: Diagnosis not present

## 2020-02-25 DIAGNOSIS — E7220 Disorder of urea cycle metabolism, unspecified: Secondary | ICD-10-CM | POA: Diagnosis present

## 2020-02-25 DIAGNOSIS — R4689 Other symptoms and signs involving appearance and behavior: Secondary | ICD-10-CM | POA: Diagnosis not present

## 2020-02-25 DIAGNOSIS — R4182 Altered mental status, unspecified: Secondary | ICD-10-CM | POA: Diagnosis present

## 2020-02-25 DIAGNOSIS — Z8249 Family history of ischemic heart disease and other diseases of the circulatory system: Secondary | ICD-10-CM | POA: Diagnosis not present

## 2020-02-25 DIAGNOSIS — S0292XA Unspecified fracture of facial bones, initial encounter for closed fracture: Secondary | ICD-10-CM

## 2020-02-25 DIAGNOSIS — S069XAA Unspecified intracranial injury with loss of consciousness status unknown, initial encounter: Secondary | ICD-10-CM | POA: Diagnosis present

## 2020-02-25 DIAGNOSIS — Z79899 Other long term (current) drug therapy: Secondary | ICD-10-CM | POA: Diagnosis not present

## 2020-02-25 DIAGNOSIS — B964 Proteus (mirabilis) (morganii) as the cause of diseases classified elsewhere: Secondary | ICD-10-CM | POA: Diagnosis present

## 2020-02-25 DIAGNOSIS — F17210 Nicotine dependence, cigarettes, uncomplicated: Secondary | ICD-10-CM | POA: Diagnosis present

## 2020-02-25 DIAGNOSIS — E785 Hyperlipidemia, unspecified: Secondary | ICD-10-CM | POA: Diagnosis present

## 2020-02-25 DIAGNOSIS — I609 Nontraumatic subarachnoid hemorrhage, unspecified: Secondary | ICD-10-CM

## 2020-02-25 DIAGNOSIS — R402363 Coma scale, best motor response, obeys commands, at hospital admission: Secondary | ICD-10-CM | POA: Diagnosis present

## 2020-02-25 DIAGNOSIS — R1312 Dysphagia, oropharyngeal phase: Secondary | ICD-10-CM | POA: Diagnosis not present

## 2020-02-25 DIAGNOSIS — Z794 Long term (current) use of insulin: Secondary | ICD-10-CM

## 2020-02-25 DIAGNOSIS — W19XXXA Unspecified fall, initial encounter: Secondary | ICD-10-CM | POA: Diagnosis present

## 2020-02-25 DIAGNOSIS — R402133 Coma scale, eyes open, to sound, at hospital admission: Secondary | ICD-10-CM | POA: Diagnosis present

## 2020-02-25 DIAGNOSIS — S0240EA Zygomatic fracture, right side, initial encounter for closed fracture: Secondary | ICD-10-CM | POA: Diagnosis present

## 2020-02-25 DIAGNOSIS — I10 Essential (primary) hypertension: Secondary | ICD-10-CM | POA: Diagnosis present

## 2020-02-25 DIAGNOSIS — S02609A Fracture of mandible, unspecified, initial encounter for closed fracture: Secondary | ICD-10-CM | POA: Diagnosis present

## 2020-02-25 DIAGNOSIS — E119 Type 2 diabetes mellitus without complications: Secondary | ICD-10-CM | POA: Diagnosis present

## 2020-02-25 DIAGNOSIS — R059 Cough, unspecified: Secondary | ICD-10-CM

## 2020-02-25 DIAGNOSIS — R05 Cough: Secondary | ICD-10-CM

## 2020-02-25 DIAGNOSIS — R1310 Dysphagia, unspecified: Secondary | ICD-10-CM | POA: Diagnosis not present

## 2020-02-25 DIAGNOSIS — S069X3S Unspecified intracranial injury with loss of consciousness of 1 hour to 5 hours 59 minutes, sequela: Secondary | ICD-10-CM | POA: Diagnosis not present

## 2020-02-25 DIAGNOSIS — R509 Fever, unspecified: Secondary | ICD-10-CM

## 2020-02-25 DIAGNOSIS — N390 Urinary tract infection, site not specified: Secondary | ICD-10-CM | POA: Diagnosis present

## 2020-02-25 DIAGNOSIS — R402223 Coma scale, best verbal response, incomprehensible words, at hospital admission: Secondary | ICD-10-CM | POA: Diagnosis present

## 2020-02-25 DIAGNOSIS — R7989 Other specified abnormal findings of blood chemistry: Secondary | ICD-10-CM | POA: Diagnosis not present

## 2020-02-25 DIAGNOSIS — S065XAA Traumatic subdural hemorrhage with loss of consciousness status unknown, initial encounter: Secondary | ICD-10-CM

## 2020-02-25 DIAGNOSIS — I6203 Nontraumatic chronic subdural hemorrhage: Secondary | ICD-10-CM | POA: Diagnosis present

## 2020-02-25 LAB — CBC WITH DIFFERENTIAL/PLATELET
Abs Immature Granulocytes: 0.05 10*3/uL (ref 0.00–0.07)
Basophils Absolute: 0 10*3/uL (ref 0.0–0.1)
Basophils Relative: 0 %
Eosinophils Absolute: 0 10*3/uL (ref 0.0–0.5)
Eosinophils Relative: 0 %
HCT: 44.2 % (ref 39.0–52.0)
Hemoglobin: 14.5 g/dL (ref 13.0–17.0)
Immature Granulocytes: 0 %
Lymphocytes Relative: 10 %
Lymphs Abs: 1.4 10*3/uL (ref 0.7–4.0)
MCH: 28.8 pg (ref 26.0–34.0)
MCHC: 32.8 g/dL (ref 30.0–36.0)
MCV: 87.9 fL (ref 80.0–100.0)
Monocytes Absolute: 0.9 10*3/uL (ref 0.1–1.0)
Monocytes Relative: 6 %
Neutro Abs: 12.5 10*3/uL — ABNORMAL HIGH (ref 1.7–7.7)
Neutrophils Relative %: 84 %
Platelets: 337 10*3/uL (ref 150–400)
RBC: 5.03 MIL/uL (ref 4.22–5.81)
RDW: 14.4 % (ref 11.5–15.5)
WBC: 14.9 10*3/uL — ABNORMAL HIGH (ref 4.0–10.5)
nRBC: 0 % (ref 0.0–0.2)

## 2020-02-25 LAB — URINALYSIS, ROUTINE W REFLEX MICROSCOPIC
Bilirubin Urine: NEGATIVE
Glucose, UA: NEGATIVE mg/dL
Ketones, ur: 15 mg/dL — AB
Leukocytes,Ua: NEGATIVE
Nitrite: NEGATIVE
Protein, ur: NEGATIVE mg/dL
Specific Gravity, Urine: 1.02 (ref 1.005–1.030)
pH: 6 (ref 5.0–8.0)

## 2020-02-25 LAB — COMPREHENSIVE METABOLIC PANEL
ALT: 26 U/L (ref 0–44)
AST: 35 U/L (ref 15–41)
Albumin: 3.8 g/dL (ref 3.5–5.0)
Alkaline Phosphatase: 65 U/L (ref 38–126)
Anion gap: 10 (ref 5–15)
BUN: 8 mg/dL (ref 6–20)
CO2: 22 mmol/L (ref 22–32)
Calcium: 8.8 mg/dL — ABNORMAL LOW (ref 8.9–10.3)
Chloride: 104 mmol/L (ref 98–111)
Creatinine, Ser: 0.78 mg/dL (ref 0.61–1.24)
GFR calc Af Amer: >60 mL/min (ref >60)
GFR calc non Af Amer: >60 mL/min (ref >60)
Glucose, Bld: 128 mg/dL — ABNORMAL HIGH (ref 70–99)
Potassium: 3.8 mmol/L (ref 3.5–5.1)
Sodium: 136 mmol/L (ref 135–145)
Total Bilirubin: 0.9 mg/dL (ref 0.3–1.2)
Total Protein: 8 g/dL (ref 6.5–8.1)

## 2020-02-25 LAB — RAPID URINE DRUG SCREEN, HOSP PERFORMED
Amphetamines: NOT DETECTED
Barbiturates: NOT DETECTED
Benzodiazepines: NOT DETECTED
Cocaine: POSITIVE — AB
Opiates: NOT DETECTED
Tetrahydrocannabinol: POSITIVE — AB

## 2020-02-25 LAB — MRSA PCR SCREENING: MRSA by PCR: NEGATIVE

## 2020-02-25 LAB — TROPONIN I (HIGH SENSITIVITY)
Troponin I (High Sensitivity): 8 ng/L (ref <18)
Troponin I (High Sensitivity): 9 ng/L (ref <18)

## 2020-02-25 LAB — URINALYSIS, MICROSCOPIC (REFLEX)

## 2020-02-25 LAB — GLUCOSE, CAPILLARY
Glucose-Capillary: 133 mg/dL — ABNORMAL HIGH (ref 70–99)
Glucose-Capillary: 98 mg/dL (ref 70–99)
Glucose-Capillary: 92 mg/dL (ref 70–99)

## 2020-02-25 LAB — CK: Total CK: 787 U/L — ABNORMAL HIGH (ref 49–397)

## 2020-02-25 LAB — HEMOGLOBIN A1C
Hgb A1c MFr Bld: 6.8 % — ABNORMAL HIGH (ref 4.8–5.6)
Mean Plasma Glucose: 148.46 mg/dL

## 2020-02-25 LAB — SALICYLATE LEVEL: Salicylate Lvl: <7 mg/dL — ABNORMAL LOW (ref 7.0–30.0)

## 2020-02-25 LAB — SARS CORONAVIRUS 2 BY RT PCR (HOSPITAL ORDER, PERFORMED IN ~~LOC~~ HOSPITAL LAB): SARS Coronavirus 2: NEGATIVE

## 2020-02-25 LAB — TSH: TSH: 0.917 u[IU]/mL (ref 0.350–4.500)

## 2020-02-25 LAB — ETHANOL: Alcohol, Ethyl (B): <10 mg/dL (ref <10)

## 2020-02-25 LAB — ACETAMINOPHEN LEVEL: Acetaminophen (Tylenol), Serum: <10 ug/mL — ABNORMAL LOW (ref 10–30)

## 2020-02-25 LAB — CBG MONITORING, ED: Glucose-Capillary: 97 mg/dL (ref 70–99)

## 2020-02-25 MED ORDER — ONDANSETRON 4 MG PO TBDP: 4.0000 mg | ORAL_TABLET | Freq: Four times a day (QID) | ORAL | Status: DC | PRN

## 2020-02-25 MED ORDER — LEVETIRACETAM 500 MG PO TABS: 500.0000 mg | ORAL_TABLET | Freq: Two times a day (BID) | ORAL | Status: DC

## 2020-02-25 MED ORDER — LEVETIRACETAM IN NACL 500 MG/100ML IV SOLN
500.0000 mg | Freq: Two times a day (BID) | INTRAVENOUS | Status: DC
  Administered 2020-02-25 – 2020-02-28 (×6): 500 mg via INTRAVENOUS
  Filled 2020-02-25 (×6): qty 100

## 2020-02-25 MED ORDER — POTASSIUM CHLORIDE IN NACL 20-0.9 MEQ/L-% IV SOLN
INTRAVENOUS | Status: DC
  Filled 2020-02-25: qty 1000

## 2020-02-25 MED ORDER — ACETAMINOPHEN 325 MG PO TABS: 650.0000 mg | ORAL_TABLET | Freq: Four times a day (QID) | ORAL | Status: DC

## 2020-02-25 MED ORDER — ONDANSETRON HCL 4 MG/2ML IJ SOLN: 4.0000 mg | Freq: Four times a day (QID) | INTRAMUSCULAR | Status: DC | PRN

## 2020-02-25 MED ORDER — OXYCODONE HCL 5 MG PO TABS: 5.0000 mg | ORAL_TABLET | ORAL | Status: DC | PRN

## 2020-02-25 MED ORDER — METOPROLOL TARTRATE 5 MG/5ML IV SOLN
5.0000 mg | Freq: Four times a day (QID) | INTRAVENOUS | Status: DC | PRN
  Administered 2020-02-26: 5 mg via INTRAVENOUS
  Filled 2020-02-25 (×2): qty 5

## 2020-02-25 MED ORDER — SODIUM CHLORIDE 0.9 % IV BOLUS
500.0000 mL | Freq: Once | INTRAVENOUS | Status: AC
  Administered 2020-02-25: 500 mL via INTRAVENOUS

## 2020-02-25 MED ORDER — THIAMINE HCL 100 MG/ML IJ SOLN
100.0000 mg | Freq: Once | INTRAMUSCULAR | Status: AC
  Administered 2020-02-25: 100 mg via INTRAVENOUS
  Filled 2020-02-25: qty 2

## 2020-02-25 MED ORDER — CHLORHEXIDINE GLUCONATE CLOTH 2 % EX PADS
6.0000 | MEDICATED_PAD | Freq: Every day | CUTANEOUS | Status: DC
  Administered 2020-02-25 – 2020-03-08 (×11): 6 via TOPICAL

## 2020-02-25 MED ORDER — PANTOPRAZOLE SODIUM 40 MG IV SOLR
40.0000 mg | Freq: Every day | INTRAVENOUS | Status: DC
  Administered 2020-02-25: 40 mg via INTRAVENOUS
  Filled 2020-02-25 (×2): qty 40

## 2020-02-25 MED ORDER — MORPHINE SULFATE (PF) 2 MG/ML IV SOLN: 2.0000 mg | INTRAVENOUS | Status: DC | PRN

## 2020-02-25 MED ORDER — CLEVIDIPINE BUTYRATE 0.5 MG/ML IV EMUL
0.0000 mg/h | INTRAVENOUS | Status: DC
  Administered 2020-02-25: 5 mg/h via INTRAVENOUS
  Administered 2020-02-25: 1 mg/h via INTRAVENOUS
  Administered 2020-02-26: 6 mg/h via INTRAVENOUS
  Filled 2020-02-25 (×4): qty 50

## 2020-02-25 MED ORDER — PANTOPRAZOLE SODIUM 40 MG PO TBEC: 40.0000 mg | DELAYED_RELEASE_TABLET | Freq: Every day | ORAL | Status: DC

## 2020-02-25 MED ORDER — MORPHINE SULFATE (PF) 4 MG/ML IV SOLN: 4.0000 mg | INTRAVENOUS | Status: DC | PRN

## 2020-02-25 MED ORDER — CHLORHEXIDINE GLUCONATE 0.12 % MT SOLN
15.0000 mL | Freq: Two times a day (BID) | OROMUCOSAL | Status: DC
  Administered 2020-02-25 – 2020-03-08 (×22): 15 mL via OROMUCOSAL
  Filled 2020-02-25 (×21): qty 15

## 2020-02-25 MED ORDER — LEVETIRACETAM IN NACL 1000 MG/100ML IV SOLN
1000.0000 mg | Freq: Once | INTRAVENOUS | Status: AC
  Administered 2020-02-25: 1000 mg via INTRAVENOUS
  Filled 2020-02-25: qty 100

## 2020-02-25 MED ORDER — INSULIN ASPART 100 UNIT/ML ~~LOC~~ SOLN
0.0000 [IU] | SUBCUTANEOUS | Status: DC
  Administered 2020-02-26 – 2020-02-29 (×4): 2 [IU] via SUBCUTANEOUS
  Administered 2020-02-29: 3 [IU] via SUBCUTANEOUS
  Administered 2020-02-29 (×4): 2 [IU] via SUBCUTANEOUS
  Administered 2020-03-01 (×4): 3 [IU] via SUBCUTANEOUS
  Administered 2020-03-02: 2 [IU] via SUBCUTANEOUS
  Administered 2020-03-02 – 2020-03-04 (×8): 3 [IU] via SUBCUTANEOUS
  Administered 2020-03-04: 2 [IU] via SUBCUTANEOUS
  Administered 2020-03-04 – 2020-03-05 (×7): 3 [IU] via SUBCUTANEOUS
  Administered 2020-03-05: 2 [IU] via SUBCUTANEOUS
  Administered 2020-03-05 – 2020-03-06 (×6): 3 [IU] via SUBCUTANEOUS
  Administered 2020-03-06: 2 [IU] via SUBCUTANEOUS
  Administered 2020-03-07 (×4): 3 [IU] via SUBCUTANEOUS
  Administered 2020-03-07: 2 [IU] via SUBCUTANEOUS
  Administered 2020-03-07 – 2020-03-08 (×2): 3 [IU] via SUBCUTANEOUS
  Administered 2020-03-08: 5 [IU] via SUBCUTANEOUS
  Administered 2020-03-08 (×2): 3 [IU] via SUBCUTANEOUS

## 2020-02-25 MED ORDER — CEFAZOLIN SODIUM-DEXTROSE 2-4 GM/100ML-% IV SOLN
2.0000 g | Freq: Once | INTRAVENOUS | Status: AC
  Administered 2020-02-25: 2 g via INTRAVENOUS
  Filled 2020-02-25: qty 100

## 2020-02-25 NOTE — ED Notes (Signed)
Nurse attempted to call [facility] to give update, left message
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
## 2020-02-25 NOTE — Progress Notes (Signed)
Called and alerted on call Trauma MD Grandville Silos that the patient had arrived to [phone] from [facility]. Physician is aware.  Patient's belongings upon admission include; 1x wallet (no cash, SS card, debit card) 2x cell phones...one cracked screen, 1x pair of jeans, 1x nike shoe, 1x shirt, and a cell phone charger.
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
## 2020-02-25 NOTE — ED Notes (Signed)
Patient been asleep all shift, moves independently for pain, has attempted a few words ,which were quickly understood.

## 2020-02-26 ENCOUNTER — Inpatient Hospital Stay (HOSPITAL_COMMUNITY): Payer: Medicare Other

## 2020-02-26 LAB — BASIC METABOLIC PANEL
Anion gap: 13 (ref 5–15)
BUN: <5 mg/dL — ABNORMAL LOW (ref 6–20)
CO2: 21 mmol/L — ABNORMAL LOW (ref 22–32)
Calcium: 8.8 mg/dL — ABNORMAL LOW (ref 8.9–10.3)
Chloride: 104 mmol/L (ref 98–111)
Creatinine, Ser: 0.95 mg/dL (ref 0.61–1.24)
GFR calc Af Amer: >60 mL/min (ref >60)
GFR calc non Af Amer: >60 mL/min (ref >60)
Glucose, Bld: 101 mg/dL — ABNORMAL HIGH (ref 70–99)
Potassium: 3.8 mmol/L (ref 3.5–5.1)
Sodium: 138 mmol/L (ref 135–145)

## 2020-02-26 LAB — CBC
HCT: 50.5 % (ref 39.0–52.0)
Hemoglobin: 16 g/dL (ref 13.0–17.0)
MCH: 28.1 pg (ref 26.0–34.0)
MCHC: 31.7 g/dL (ref 30.0–36.0)
MCV: 88.8 fL (ref 80.0–100.0)
Platelets: 305 10*3/uL (ref 150–400)
RBC: 5.69 MIL/uL (ref 4.22–5.81)
RDW: 14.5 % (ref 11.5–15.5)
WBC: 13.9 10*3/uL — ABNORMAL HIGH (ref 4.0–10.5)
nRBC: 0 % (ref 0.0–0.2)

## 2020-02-26 LAB — GLUCOSE, CAPILLARY
Glucose-Capillary: 115 mg/dL — ABNORMAL HIGH (ref 70–99)
Glucose-Capillary: 113 mg/dL — ABNORMAL HIGH (ref 70–99)
Glucose-Capillary: 111 mg/dL — ABNORMAL HIGH (ref 70–99)
Glucose-Capillary: 125 mg/dL — ABNORMAL HIGH (ref 70–99)
Glucose-Capillary: 97 mg/dL (ref 70–99)
Glucose-Capillary: 84 mg/dL (ref 70–99)

## 2020-02-26 MED ORDER — METOPROLOL TARTRATE 25 MG PO TABS
12.5000 mg | ORAL_TABLET | Freq: Two times a day (BID) | ORAL | Status: DC
  Administered 2020-02-26 – 2020-02-27 (×3): 12.5 mg
  Filled 2020-02-26 (×3): qty 1

## 2020-02-26 MED ORDER — METOPROLOL TARTRATE 5 MG/5ML IV SOLN: 5.0000 mg | Freq: Four times a day (QID) | INTRAVENOUS | Status: DC | PRN

## 2020-02-26 MED ORDER — SODIUM CHLORIDE 0.9 % IV SOLN: INTRAVENOUS | Status: DC

## 2020-02-26 MED ORDER — NICOTINE 14 MG/24HR TD PT24
14.0000 mg | MEDICATED_PATCH | Freq: Every day | TRANSDERMAL | Status: DC
  Administered 2020-02-26 – 2020-03-08 (×11): 14 mg via TRANSDERMAL
  Filled 2020-02-26 (×12): qty 1

## 2020-02-26 MED ORDER — METHOCARBAMOL 500 MG PO TABS
1000.0000 mg | ORAL_TABLET | Freq: Three times a day (TID) | ORAL | Status: DC
  Administered 2020-02-26 – 2020-03-01 (×12): 1000 mg
  Filled 2020-02-26 (×12): qty 2

## 2020-02-26 MED ORDER — MORPHINE SULFATE (PF) 2 MG/ML IV SOLN: 2.0000 mg | INTRAVENOUS | Status: DC | PRN

## 2020-02-26 MED ORDER — IPRATROPIUM-ALBUTEROL 0.5-2.5 (3) MG/3ML IN SOLN
3.0000 mL | RESPIRATORY_TRACT | Status: DC | PRN
  Administered 2020-02-26: 3 mL via RESPIRATORY_TRACT
  Filled 2020-02-26: qty 3

## 2020-02-26 MED ORDER — ACETAMINOPHEN 500 MG PO TABS
1000.0000 mg | ORAL_TABLET | Freq: Four times a day (QID) | ORAL | Status: DC
  Administered 2020-02-26 – 2020-03-01 (×15): 1000 mg
  Filled 2020-02-26 (×16): qty 2
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
## 2020-02-26 NOTE — Progress Notes (Signed)
Paged and spoke with on Call Trauma MD Grandville Silos.  Alerted him that the patient was desating in to the 70s and that he had a non productive cough.  Placed the patient on a non-rebreather mask and NTS suctioned him.  MD ordered a duoneb treatment be adminsitered.

## 2020-02-26 NOTE — Procedures (Signed)
Cortrak  Person Inserting Tube:  Rosezetta Schlatter, RD Tube Type:  Cortrak - 43 inches Tube Location:  Left nare Initial Placement:  Stomach Secured by: Bridle Technique Used to Measure Tube Placement:  Documented cm marking at nare/ corner of mouth Cortrak Secured At:  67 cm Procedure Comments:  Cortrak Tube Team Note:  Consult received to place a Cortrak feeding tube.   No x-ray is required. RN may begin using tube.    If the tube becomes dislodged please keep the tube and contact the Cortrak team at [email or web address] ([email or web address]) for replacement.  If after hours and replacement cannot be delayed, place a NG tube and confirm placement with an abdominal x-ray.    Jarome Matin, MS, RD, LDN, CNSC Inpatient Clinical Dietitian RD pager # available in [place]  After hours/weekend pager # available in [facility]

## 2020-02-26 NOTE — Progress Notes (Signed)
SLP Cancellation Note  Patient Details Name: JEISON DELPILAR MRN: [id number] DOB: 20-Feb-1976   Cancelled treatment:       Reason Eval/Treat Not Completed: Patient not medically ready. Patient currently on 100% non-rebreather for O2. Will allow to rest today and f/u 7/31.   Jesson Foskey MA, CCC-SLP     Onyx Edgley Meryl 02/26/2020, 11:03 AM

## 2020-02-26 NOTE — Progress Notes (Signed)
RT NOTE: RT attempted to place patient on salter [place] at 10L to get patient off NRB. Patient is mouth breather and sats would not come above 88-89%. RT placed patient on 40% venturi mask at 8L. Sats improved to 95%. Vitals are stable. RT will continue to monitor.

## 2020-02-26 NOTE — Progress Notes (Signed)
PT Cancellation Note  Patient Details Name: Phillip Barrett MRN: [id number] DOB: [birth year]   Cancelled Treatment:    Reason Eval/Treat Not Completed: Patient not medically ready; patient on 100% NRB with HR 122 and soft BP. RN reports pt not following commands.  Will attempt another day.   Reginia Naas 02/26/2020, 9:56 AM  Magda Kiel, PT Acute Rehabilitation Services YTRZN:[phone] Office:[phone] 02/26/2020

## 2020-02-26 NOTE — Progress Notes (Signed)
OT Cancellation Note  Patient Details Name: SOMA LIZAK MRN: [id number] DOB: 1976-05-23   Cancelled Treatment:    Reason Eval/Treat Not Completed: Patient not medically ready.  Pt on 100% NRB and sustained HR 122 at rest.  Will reattempt.  Nilsa Nutting., OTR/L Acute Rehabilitation Services Pager [phone] Office [phone]   Lucille Passy M 02/26/2020, 10:33 AM
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
## 2020-02-27 LAB — GLUCOSE, CAPILLARY
Glucose-Capillary: 97 mg/dL (ref 70–99)
Glucose-Capillary: 82 mg/dL (ref 70–99)
Glucose-Capillary: 82 mg/dL (ref 70–99)
Glucose-Capillary: 75 mg/dL (ref 70–99)
Glucose-Capillary: 104 mg/dL — ABNORMAL HIGH (ref 70–99)
Glucose-Capillary: 97 mg/dL (ref 70–99)

## 2020-02-27 MED ORDER — PIVOT 1.5 CAL PO LIQD
1000.0000 mL | ORAL | Status: DC
  Administered 2020-02-27: 1000 mL
  Filled 2020-02-27: qty 1000

## 2020-02-27 MED ORDER — VITAL HIGH PROTEIN PO LIQD: 1000.0000 mL | ORAL | Status: DC

## 2020-02-27 NOTE — Progress Notes (Signed)
Inpatient Rehab Admissions Coordinator Note:   Per PT recommendation, pt was screened for CIR candidacy by Gayland Curry, MS, CCC-SLP.  At this time we are recommending an Inpatient Rehab consult.  AC will place consult order per protocol.  Please contact me with questions.    Gayland Curry, [place], [place] Admissions Coordinator [phone] 02/27/20 4:51 PM
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
## 2020-02-27 NOTE — Plan of Care (Signed)
  Problem: Education: Goal: Knowledge of General Education information will improve Description: Including pain rating scale, medication(s)/side effects and non-pharmacologic comfort measures Outcome: Progressing   Problem: Health Behavior/Discharge Planning: Goal: Ability to manage health-related needs will improve Outcome: Progressing   Problem: Clinical Measurements: Goal: Ability to maintain clinical measurements within normal limits will improve Outcome: Progressing Goal: Will remain free from infection Outcome: Progressing Goal: Diagnostic test results will improve Outcome: Progressing Goal: Cardiovascular complication will be avoided Outcome: Progressing   Problem: Activity: Goal: Risk for activity intolerance will decrease Outcome: Progressing   Problem: Coping: Goal: Level of anxiety will decrease Outcome: Progressing   Problem: Elimination: Goal: Will not experience complications related to bowel motility Outcome: Progressing   Problem: Pain Managment: Goal: General experience of comfort will improve Outcome: Progressing   Problem: Safety: Goal: Ability to remain free from injury will improve Outcome: Progressing   Problem: Skin Integrity: Goal: Risk for impaired skin integrity will decrease Outcome: Progressing   Problem: Education: Goal: Knowledge of the prescribed therapeutic regimen Outcome: Progressing Goal: Knowledge of disease or condition will improve Outcome: Progressing   Problem: Tissue Perfusion: Goal: Ability to maintain intracranial pressure will improve Outcome: Progressing   Problem: Respiratory: Goal: Will regain and/or maintain adequate ventilation Outcome: Progressing   Problem: Skin Integrity: Goal: Risk for impaired skin integrity will decrease Outcome: Progressing Goal: Demonstration of wound healing without infection will improve Outcome: Progressing   Problem: Health Behavior/Discharge Planning: Goal: Ability to manage  health-related needs will improve Outcome: Progressing   Problem: Clinical Measurements: Goal: Respiratory complications will improve Outcome: Not Progressing   Problem: Nutrition: Goal: Adequate nutrition will be maintained Outcome: Not Progressing- pt high risk for aspiration, unable to tolerate PO at this time   Problem: Elimination: Goal: Will not experience complications related to urinary retention Outcome: Not Progressing- pt unable to void on his own, straight cath once during day shift, once on night shigt   Problem: Clinical Measurements: Goal: Neurologic status will improve Outcome: Not Progressing -pt not follow commands, only responds to pain   Problem: Psychosocial: Goal: Ability to verbalize positive feelings about self will improve Outcome: Not Progressing -pt not verbalizing at this time Goal: Ability to participate in self-care as condition permits will improve Outcome: Not Progressing Goal: Ability to identify appropriate support needs will improve Outcome: Not Progressing   Problem: Nutritional: Goal: Risk of aspiration will decrease Outcome: Not Progressing Goal: Dietary intake will improve Outcome: Not Progressing -pt unable to tolerate POs at this time. Cortrak in place   Problem: Communication: Goal: Ability to communicate needs accurately will improve Outcome: Not Progressing

## 2020-02-28 LAB — GLUCOSE, CAPILLARY
Glucose-Capillary: 140 mg/dL — ABNORMAL HIGH (ref 70–99)
Glucose-Capillary: 131 mg/dL — ABNORMAL HIGH (ref 70–99)
Glucose-Capillary: 99 mg/dL (ref 70–99)
Glucose-Capillary: 108 mg/dL — ABNORMAL HIGH (ref 70–99)
Glucose-Capillary: 103 mg/dL — ABNORMAL HIGH (ref 70–99)

## 2020-02-28 MED ORDER — LORAZEPAM 1 MG PO TABS: 1.0000 mg | ORAL_TABLET | ORAL | Status: AC | PRN

## 2020-02-28 MED ORDER — SPIRITUS FRUMENTI
1.0000 | Freq: Two times a day (BID) | ORAL | Status: DC
  Administered 2020-02-28 (×2): 1 via ORAL
  Filled 2020-02-28 (×13): qty 1

## 2020-02-28 MED ORDER — OXYCODONE HCL 5 MG/5ML PO SOLN
5.0000 mg | ORAL | Status: DC | PRN
  Administered 2020-02-28: 10 mg
  Filled 2020-02-28: qty 10

## 2020-02-28 MED ORDER — THIAMINE HCL 100 MG PO TABS
100.0000 mg | ORAL_TABLET | Freq: Every day | ORAL | Status: DC
  Administered 2020-02-28 – 2020-02-29 (×2): 100 mg via ORAL
  Filled 2020-02-28 (×3): qty 1

## 2020-02-28 MED ORDER — DOCUSATE SODIUM 100 MG PO CAPS
100.0000 mg | ORAL_CAPSULE | Freq: Two times a day (BID) | ORAL | Status: DC
  Administered 2020-02-28: 100 mg via ORAL
  Filled 2020-02-28 (×4): qty 1

## 2020-02-28 MED ORDER — METOPROLOL TARTRATE 25 MG PO TABS
25.0000 mg | ORAL_TABLET | Freq: Two times a day (BID) | ORAL | Status: DC
  Administered 2020-02-28 – 2020-03-01 (×5): 25 mg
  Filled 2020-02-28 (×5): qty 1

## 2020-02-28 MED ORDER — LORAZEPAM 2 MG/ML IJ SOLN
1.0000 mg | INTRAMUSCULAR | Status: AC | PRN
  Administered 2020-03-01 – 2020-03-02 (×3): 2 mg via INTRAVENOUS
  Filled 2020-02-28 (×3): qty 1

## 2020-02-28 MED ORDER — LEVETIRACETAM 100 MG/ML PO SOLN
500.0000 mg | Freq: Two times a day (BID) | ORAL | Status: DC
  Administered 2020-02-28 – 2020-03-01 (×4): 500 mg
  Filled 2020-02-28 (×4): qty 5

## 2020-02-28 MED ORDER — ADULT MULTIVITAMIN W/MINERALS CH
1.0000 | ORAL_TABLET | Freq: Every day | ORAL | Status: DC
  Administered 2020-02-28 – 2020-02-29 (×2): 1 via ORAL
  Filled 2020-02-28 (×5): qty 1

## 2020-02-28 MED ORDER — FOLIC ACID 1 MG PO TABS
1.0000 mg | ORAL_TABLET | Freq: Every day | ORAL | Status: DC
  Administered 2020-02-28 – 2020-03-01 (×3): 1 mg via ORAL
  Filled 2020-02-28 (×3): qty 1

## 2020-02-28 MED ORDER — THIAMINE HCL 100 MG/ML IJ SOLN
100.0000 mg | Freq: Every day | INTRAMUSCULAR | Status: DC
  Administered 2020-03-01 – 2020-03-08 (×8): 100 mg via INTRAVENOUS
  Filled 2020-02-28 (×8): qty 2
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
## 2020-02-28 NOTE — Progress Notes (Signed)
Inpatient Rehab Admissions:  Inpatient Rehab Consult received.  I met with patient at the bedside to attempt a rehabilitation assessment and to attempt to discuss goals and expectations of an inpatient rehab admission.  Pt was alert but not oriented. Pt preoccupied with requesting beer and cigarettes.  Unable to determine any family or friends to call to explain CIR.  NSG reported no family/friends visiting yesterday or today.  Will continue to follow pt's progress with therapies and medical workup.  Signed: Gayland Curry, [place], [place] Admissions Coordinator [phone]
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
## 2020-02-29 ENCOUNTER — Inpatient Hospital Stay (HOSPITAL_COMMUNITY): Payer: Medicare Other

## 2020-02-29 DIAGNOSIS — I609 Nontraumatic subarachnoid hemorrhage, unspecified: Secondary | ICD-10-CM

## 2020-02-29 LAB — BASIC METABOLIC PANEL
Anion gap: 12 (ref 5–15)
BUN: 6 mg/dL (ref 6–20)
CO2: 23 mmol/L (ref 22–32)
Calcium: 8.7 mg/dL — ABNORMAL LOW (ref 8.9–10.3)
Chloride: 105 mmol/L (ref 98–111)
Creatinine, Ser: 0.6 mg/dL — ABNORMAL LOW (ref 0.61–1.24)
GFR calc Af Amer: >60 mL/min (ref >60)
GFR calc non Af Amer: >60 mL/min (ref >60)
Glucose, Bld: 122 mg/dL — ABNORMAL HIGH (ref 70–99)
Potassium: 3.8 mmol/L (ref 3.5–5.1)
Sodium: 140 mmol/L (ref 135–145)

## 2020-02-29 LAB — GLUCOSE, CAPILLARY
Glucose-Capillary: 121 mg/dL — ABNORMAL HIGH (ref 70–99)
Glucose-Capillary: 123 mg/dL — ABNORMAL HIGH (ref 70–99)
Glucose-Capillary: 123 mg/dL — ABNORMAL HIGH (ref 70–99)
Glucose-Capillary: 126 mg/dL — ABNORMAL HIGH (ref 70–99)
Glucose-Capillary: 129 mg/dL — ABNORMAL HIGH (ref 70–99)
Glucose-Capillary: 131 mg/dL — ABNORMAL HIGH (ref 70–99)
Glucose-Capillary: 180 mg/dL — ABNORMAL HIGH (ref 70–99)

## 2020-02-29 LAB — BLOOD GAS, ARTERIAL
Acid-Base Excess: 2.3 mmol/L — ABNORMAL HIGH (ref 0.0–2.0)
Bicarbonate: 26.1 mmol/L (ref 20.0–28.0)
Drawn by: 535471
FIO2: 21
O2 Saturation: 94.8 %
Patient temperature: 37.6
pCO2 arterial: 40 mmHg (ref 32.0–48.0)
pH, Arterial: 7.433 (ref 7.350–7.450)
pO2, Arterial: 76 mmHg — ABNORMAL LOW (ref 83.0–108.0)

## 2020-02-29 LAB — CBC
HCT: 38.4 % — ABNORMAL LOW (ref 39.0–52.0)
Hemoglobin: 12.5 g/dL — ABNORMAL LOW (ref 13.0–17.0)
MCH: 28.5 pg (ref 26.0–34.0)
MCHC: 32.6 g/dL (ref 30.0–36.0)
MCV: 87.5 fL (ref 80.0–100.0)
Platelets: 330 10*3/uL (ref 150–400)
RBC: 4.39 MIL/uL (ref 4.22–5.81)
RDW: 13.9 % (ref 11.5–15.5)
WBC: 8.8 10*3/uL (ref 4.0–10.5)
nRBC: 0 % (ref 0.0–0.2)

## 2020-02-29 LAB — AMMONIA: Ammonia: 42 umol/L — ABNORMAL HIGH (ref 9–35)

## 2020-02-29 MED ORDER — PIVOT 1.5 CAL PO LIQD
1000.0000 mL | ORAL | Status: DC
  Administered 2020-02-29 – 2020-03-03 (×4): 1000 mL
  Filled 2020-02-29 (×5): qty 1000

## 2020-02-29 MED ORDER — MORPHINE SULFATE (PF) 2 MG/ML IV SOLN
2.0000 mg | INTRAVENOUS | Status: DC | PRN
  Administered 2020-03-01: 2 mg via INTRAVENOUS
  Filled 2020-02-29: qty 1

## 2020-02-29 MED ORDER — LACTULOSE 10 GM/15ML PO SOLN
10.0000 g | Freq: Once | ORAL | Status: AC
  Administered 2020-02-29: 10 g
  Filled 2020-02-29: qty 30

## 2020-02-29 MED ORDER — FREE WATER
150.0000 mL | Status: DC
  Administered 2020-02-29 – 2020-03-08 (×42): 150 mL

## 2020-02-29 MED ORDER — PIVOT 1.5 CAL PO LIQD
1000.0000 mL | ORAL | Status: DC
  Filled 2020-02-29: qty 1000

## 2020-02-29 MED ORDER — DOCUSATE SODIUM 50 MG/5ML PO LIQD
100.0000 mg | Freq: Two times a day (BID) | ORAL | Status: DC
  Administered 2020-02-29 – 2020-03-08 (×14): 100 mg
  Filled 2020-02-29 (×14): qty 10
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
## 2020-02-29 NOTE — Significant Event (Addendum)
Rapid Response Progress Rounding Note Recent transfer out from ICU  Initial Focused Assessment:  Pt lying in bed. Responded to physical stimuli- per previous provider notes pt has been somnolent but will arouse to stimulation. Would not open eyes. PERRLA, 65mm. Right eye conjunctivae redden. Bilateral eye edema. Oriented to person. Did not provide response to additional orientation questions.  Purposeful movement bilateral upper and lower extremities. Lung sounds are diminished, rhonchus throughout. Upper airway congestion. Pt has congested cough, unable to cough up secretions. Oral secretions are thin, clear.   VS: T 99.14F, BP 131/73, HR 92, RR 16, SpO2 97% on room air - CBG 131 this morning  Interventions: -Oral care -Spoke with B. Meuth, PA regarding concerns for aspirations and LOC - orders received for CXR, CBC, BMP, Ammonia, ABG  Plan of Care: -Oral care per protocol -Consider NTS  Addendum 02/29/2020 1610: -Pt more verbal, requesting staff leave him alone during vital sign check. Pt follows commands, keeps his eyes closed. Upper airway congestion remains audible. Encouraged pt coughing. HOB elevated.  VS: T 98.1, BP 151/79, HR 91, RR 16, SpO2 99% on room air  Event Summary: MD Notified: B. Meuth, PA Arrival Time: 1030 End Time:  [place], RN
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
## 2020-03-01 LAB — GLUCOSE, CAPILLARY
Glucose-Capillary: 189 mg/dL — ABNORMAL HIGH (ref 70–99)
Glucose-Capillary: 189 mg/dL — ABNORMAL HIGH (ref 70–99)
Glucose-Capillary: 193 mg/dL — ABNORMAL HIGH (ref 70–99)
Glucose-Capillary: 193 mg/dL — ABNORMAL HIGH (ref 70–99)
Glucose-Capillary: 158 mg/dL — ABNORMAL HIGH (ref 70–99)
Glucose-Capillary: 156 mg/dL — ABNORMAL HIGH (ref 70–99)
Glucose-Capillary: 123 mg/dL — ABNORMAL HIGH (ref 70–99)
Glucose-Capillary: 117 mg/dL — ABNORMAL HIGH (ref 70–99)

## 2020-03-01 LAB — AMMONIA: Ammonia: 76 umol/L — ABNORMAL HIGH (ref 9–35)

## 2020-03-01 MED ORDER — METOPROLOL TARTRATE 5 MG/5ML IV SOLN
2.5000 mg | Freq: Four times a day (QID) | INTRAVENOUS | Status: DC
  Administered 2020-03-01 – 2020-03-08 (×27): 2.5 mg via INTRAVENOUS
  Filled 2020-03-01 (×26): qty 5

## 2020-03-01 MED ORDER — LACTULOSE 10 GM/15ML PO SOLN
20.0000 g | Freq: Once | ORAL | Status: AC
  Administered 2020-03-01: 20 g
  Filled 2020-03-01: qty 30

## 2020-03-01 MED ORDER — METHOCARBAMOL 1000 MG/10ML IJ SOLN
1000.0000 mg | Freq: Three times a day (TID) | INTRAVENOUS | Status: DC
  Administered 2020-03-01 – 2020-03-04 (×8): 1000 mg via INTRAVENOUS
  Filled 2020-03-01 (×9): qty 10
  Filled 2020-03-01: qty 1000

## 2020-03-01 MED ORDER — LEVETIRACETAM IN NACL 500 MG/100ML IV SOLN
500.0000 mg | Freq: Two times a day (BID) | INTRAVENOUS | Status: DC
  Administered 2020-03-01 – 2020-03-03 (×5): 500 mg via INTRAVENOUS
  Filled 2020-03-01 (×5): qty 100

## 2020-03-01 NOTE — Progress Notes (Signed)
Pt removed his corktrak and MD on-call paged and notified. New orders received. MD ordered to hold feeding overnight, pt po med's route changed to IV per pharmacy calculations and new corktrak to be placed tomorrow by [facility] team. Will continue to closely monitor pt. Delia Heady RN
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
## 2020-03-01 NOTE — Plan of Care (Signed)
  Problem: Clinical Measurements: Goal: Respiratory complications will improve Outcome: Progressing   Problem: Activity: Goal: Risk for activity intolerance will decrease Outcome: Progressing   Problem: Coping: Goal: Level of anxiety will decrease Outcome: Progressing   Problem: Safety: Goal: Ability to remain free from injury will improve Outcome: Progressing   

## 2020-03-02 LAB — BASIC METABOLIC PANEL
Anion gap: 13 (ref 5–15)
BUN: 6 mg/dL (ref 6–20)
CO2: 23 mmol/L (ref 22–32)
Calcium: 9.3 mg/dL (ref 8.9–10.3)
Chloride: 100 mmol/L (ref 98–111)
Creatinine, Ser: 0.65 mg/dL (ref 0.61–1.24)
GFR calc Af Amer: >60 mL/min (ref >60)
GFR calc non Af Amer: >60 mL/min (ref >60)
Glucose, Bld: 142 mg/dL — ABNORMAL HIGH (ref 70–99)
Potassium: 3.8 mmol/L (ref 3.5–5.1)
Sodium: 136 mmol/L (ref 135–145)

## 2020-03-02 LAB — GLUCOSE, CAPILLARY
Glucose-Capillary: 146 mg/dL — ABNORMAL HIGH (ref 70–99)
Glucose-Capillary: 106 mg/dL — ABNORMAL HIGH (ref 70–99)
Glucose-Capillary: 111 mg/dL — ABNORMAL HIGH (ref 70–99)
Glucose-Capillary: 152 mg/dL — ABNORMAL HIGH (ref 70–99)
Glucose-Capillary: 173 mg/dL — ABNORMAL HIGH (ref 70–99)

## 2020-03-02 LAB — CBC
HCT: 42.1 % (ref 39.0–52.0)
Hemoglobin: 13.8 g/dL (ref 13.0–17.0)
MCH: 28 pg (ref 26.0–34.0)
MCHC: 32.8 g/dL (ref 30.0–36.0)
MCV: 85.6 fL (ref 80.0–100.0)
Platelets: 360 10*3/uL (ref 150–400)
RBC: 4.92 MIL/uL (ref 4.22–5.81)
RDW: 13.6 % (ref 11.5–15.5)
WBC: 10.6 10*3/uL — ABNORMAL HIGH (ref 4.0–10.5)
nRBC: 0 % (ref 0.0–0.2)

## 2020-03-02 LAB — AMMONIA: Ammonia: 31 umol/L (ref 9–35)

## 2020-03-02 MED ORDER — ACETAMINOPHEN 650 MG RE SUPP
650.0000 mg | Freq: Four times a day (QID) | RECTAL | Status: DC
  Administered 2020-03-07: 650 mg via RECTAL
  Filled 2020-03-02 (×2): qty 1

## 2020-03-02 MED ORDER — QUETIAPINE FUMARATE 50 MG PO TABS
50.0000 mg | ORAL_TABLET | Freq: Two times a day (BID) | ORAL | Status: DC
  Filled 2020-03-02: qty 1

## 2020-03-02 MED ORDER — QUETIAPINE FUMARATE 50 MG PO TABS
50.0000 mg | ORAL_TABLET | Freq: Two times a day (BID) | ORAL | Status: DC
  Administered 2020-03-02 – 2020-03-07 (×11): 50 mg
  Filled 2020-03-02 (×11): qty 1

## 2020-03-02 MED ORDER — FOLIC ACID 5 MG/ML IJ SOLN
1.0000 mg | Freq: Every day | INTRAMUSCULAR | Status: DC
  Administered 2020-03-02 – 2020-03-08 (×7): 1 mg via INTRAVENOUS
  Filled 2020-03-02 (×8): qty 0.2

## 2020-03-02 MED ORDER — ACETAMINOPHEN 500 MG PO TABS
1000.0000 mg | ORAL_TABLET | Freq: Four times a day (QID) | ORAL | Status: DC
  Administered 2020-03-02 – 2020-03-08 (×20): 1000 mg
  Filled 2020-03-02 (×23): qty 2

## 2020-03-02 MED ORDER — CLONAZEPAM 0.125 MG PO TBDP
1.0000 mg | ORAL_TABLET | Freq: Two times a day (BID) | ORAL | Status: DC
  Administered 2020-03-02 – 2020-03-03 (×2): 1 mg
  Filled 2020-03-02 (×2): qty 8

## 2020-03-02 MED ORDER — FOLIC ACID 1 MG PO TABS
1.0000 mg | ORAL_TABLET | Freq: Every day | ORAL | Status: DC
  Filled 2020-03-02 (×4): qty 1
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
## 2020-03-02 NOTE — TOC CAGE-AID Note (Signed)
Transition of Care [facility]) - CAGE-AID Screening   Patient Details  Name: Phillip Barrett MRN: [id number] Date of Birth: 08-25-1975  Transition of Care [facility]) CM/SW Contact:    Emeterio Reeve, [place] Phone Number: 03/02/2020, 12:02 PM   Clinical Narrative: Pt was unable to participate in assessment due to only being oriented to person. Pt has a history of cocaine and THC use. Please reconsult when pt is able to participate.    CAGE-AID Screening: Substance Abuse Screening unable to be completed due to: : Patient unable to participate                  [place] Clinical Social Worker [phone]

## 2020-03-02 NOTE — Progress Notes (Signed)
Pt was very agitated and combative during the beginning of shift. Pt tried to get out of bed and mostly irritable, pulled out his IV and condom cath. Prn ativan adm to pt was effective. Reported off to oncoming RN. Delia Heady RN

## 2020-03-02 NOTE — Procedures (Signed)
Cortrak ° °Tube Type:  Cortrak - 43 inches °Tube Location:  Left nare °Initial Placement:  Stomach °Secured by: Bridle °Technique Used to Measure Tube Placement:  Documented cm marking at nare/ corner of mouth °Cortrak Secured At:  70 cm ° ° ° °Cortrak Tube Team Note: ° °Consult received to place a Cortrak feeding tube.  ° °No x-ray is required. RN may begin using tube.  ° °If the tube becomes dislodged please keep the tube and contact the Cortrak team at [email or web address] (password [email or web address]) for replacement.  °If after hours and replacement cannot be delayed, place a NG tube and confirm placement with an abdominal x-ray.  ° ° °Danaiya Steadman MS, RD, LDN °Please refer to AMION for RD and/or RD on-call/weekend/after hours pager ° °
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
## 2020-03-02 NOTE — Plan of Care (Signed)
  Problem: Activity: Goal: Risk for activity intolerance will decrease Outcome: Progressing   Problem: Coping: Goal: Level of anxiety will decrease Outcome: Progressing   Problem: Pain Managment: Goal: General experience of comfort will improve Outcome: Progressing   
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
## 2020-03-03 LAB — GLUCOSE, CAPILLARY
Glucose-Capillary: 111 mg/dL — ABNORMAL HIGH (ref 70–99)
Glucose-Capillary: 154 mg/dL — ABNORMAL HIGH (ref 70–99)
Glucose-Capillary: 161 mg/dL — ABNORMAL HIGH (ref 70–99)
Glucose-Capillary: 165 mg/dL — ABNORMAL HIGH (ref 70–99)
Glucose-Capillary: 168 mg/dL — ABNORMAL HIGH (ref 70–99)
Glucose-Capillary: 178 mg/dL — ABNORMAL HIGH (ref 70–99)
Glucose-Capillary: 188 mg/dL — ABNORMAL HIGH (ref 70–99)

## 2020-03-03 MED ORDER — GLUCERNA 1.5 CAL PO LIQD
1000.0000 mL | ORAL | Status: DC
  Administered 2020-03-03 – 2020-03-07 (×5): 1000 mL
  Filled 2020-03-03 (×10): qty 1000

## 2020-03-03 MED ORDER — PROSOURCE TF PO LIQD
45.0000 mL | Freq: Two times a day (BID) | ORAL | Status: DC
  Administered 2020-03-03 – 2020-03-08 (×11): 45 mL
  Filled 2020-03-03 (×12): qty 45

## 2020-03-03 NOTE — Progress Notes (Signed)
IP rehab admissions:  Please see rehab consult done by Dr. Ranell Patrick on 02/29/20.  I am following for potential acute inpatient rehab admission.  Patient asleep on rounds for the past 4 times that I have tried to see him.  Currently he is requiring total Assist for mobility and is not participating well.  I called and left a message with his father.  I tried to call his friend, Marcie Bal, listed on the facesheet, but she did not want to talk with me. Patient will need 24/7 supervision at a minimum after a potential CIR stay.  If he does not have 24/7 supervision, then he will need SNF placement to allow sufficient recovery time.  Call me for questions.  [phone]
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
## 2020-03-03 NOTE — Progress Notes (Signed)
SLP Cancellation Note  Patient Details Name: ESMERALDA BLANFORD MRN: [id number] DOB: May 17, 1976   Cancelled treatment:       Reason Eval/Treat Not Completed: Patient's level of consciousness. Pt heavily sleeping and just given 50 mg of Seoquel. Pt will not be able to participate in therapy or consume PO until he is less sedated.    Christain Mcraney, Katherene Ponto 03/03/2020, 9:14 AM
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
## 2020-03-04 ENCOUNTER — Inpatient Hospital Stay (HOSPITAL_COMMUNITY): Payer: Medicare Other

## 2020-03-04 LAB — GLUCOSE, CAPILLARY
Glucose-Capillary: 137 mg/dL — ABNORMAL HIGH (ref 70–99)
Glucose-Capillary: 152 mg/dL — ABNORMAL HIGH (ref 70–99)
Glucose-Capillary: 154 mg/dL — ABNORMAL HIGH (ref 70–99)
Glucose-Capillary: 158 mg/dL — ABNORMAL HIGH (ref 70–99)
Glucose-Capillary: 168 mg/dL — ABNORMAL HIGH (ref 70–99)
Glucose-Capillary: 180 mg/dL — ABNORMAL HIGH (ref 70–99)

## 2020-03-04 LAB — AMMONIA: Ammonia: 43 umol/L — ABNORMAL HIGH (ref 9–35)

## 2020-03-04 MED ORDER — CLONAZEPAM 0.25 MG PO TBDP
0.5000 mg | ORAL_TABLET | Freq: Two times a day (BID) | ORAL | Status: DC
  Administered 2020-03-04 (×2): 0.5 mg
  Filled 2020-03-04 (×3): qty 2

## 2020-03-04 MED ORDER — LACTULOSE 10 GM/15ML PO SOLN
10.0000 g | Freq: Once | ORAL | Status: AC
  Administered 2020-03-04: 10 g
  Filled 2020-03-04: qty 30

## 2020-03-04 MED ORDER — GUAIFENESIN 100 MG/5ML PO SOLN: 5.0000 mL | ORAL | Status: DC | PRN

## 2020-03-04 MED ORDER — OXYCODONE HCL 5 MG/5ML PO SOLN: 5.0000 mg | ORAL | Status: DC | PRN

## 2020-03-04 MED ORDER — SODIUM CHLORIDE 0.9% FLUSH
10.0000 mL | Freq: Two times a day (BID) | INTRAVENOUS | Status: DC
  Administered 2020-03-04 – 2020-03-08 (×8): 10 mL

## 2020-03-04 MED ORDER — SODIUM CHLORIDE 0.9% FLUSH: 10.0000 mL | INTRAVENOUS | Status: DC | PRN

## 2020-03-04 MED ORDER — METHOCARBAMOL 1000 MG/10ML IJ SOLN: 1000.0000 mg | Freq: Three times a day (TID) | INTRAVENOUS | Status: DC | PRN

## 2020-03-04 MED ORDER — ADULT MULTIVITAMIN W/MINERALS CH
1.0000 | ORAL_TABLET | Freq: Every day | ORAL | Status: DC
  Administered 2020-03-05 – 2020-03-08 (×4): 1
  Filled 2020-03-04 (×4): qty 1
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
## 2020-03-04 NOTE — Progress Notes (Signed)
Modified Barium Swallow Progress Note  Patient Details  Name: Phillip Barrett MRN: [id number] Date of Birth: [birth year]  Today's Date: 03/04/2020  Modified Barium Swallow completed.  Full report located under Chart Review in the Imaging Section.  Brief recommendations include the following:  Clinical Impression  Multiple factors prevent safe po consumption as evidenced during MBS. Pt had not had sedating meds prior to study (Keppra, Klonopin or Seroquel) and was drowy but stimulable and able to adequately participate. Oral impairments include poor control/containment, reduced lingual strength that led to lingual residue and diffuse residue throughout oral cavity. Tongue base retraction and pharyngeal wall approximation is decreased with incomplete prolulsion of bolus with mild backflow into oral cavity x 2. Pharyngeal impairments were relatively mild and largest risk factor is vallecular and pyriform sinus residue that is penetrated after the swallow followed by strong reflexive cough. He had explosive coughs at rest and is likely aspirating his saliva.. Given that his scheduled meds are sedating, cognitive deficits with decreased awareness, recommend he continue NPO status. ST can work on trials with puree at bedside (multiple swallows and throat clears). Decreasing sedating meds would improve ability to rehabilitate swallow.         Swallow Evaluation Recommendations       SLP Diet Recommendations: NPO       Medication Administration: Via alternative means               Oral Care Recommendations: Oral care QID        [name] 03/04/2020,12:59 PM   [name] Colvin Caroli.Ed Risk analyst [phone] Office [phone]
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
## 2020-03-04 NOTE — Progress Notes (Signed)
IP rehab admissions - I spoke with patient's father by phone in [place].  Dad is 44 yo and cannot provide care for patient after a rehab stay.  Dad is in agreement to SNF placement.  Dad says he has no family available to assist or provide supervision.  I will follow up again next week.  I have talked with Almyra Free, trauma case manager, who is aware of the situation above.  Call me for questions.  [phone]
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
## 2020-03-05 ENCOUNTER — Inpatient Hospital Stay (HOSPITAL_COMMUNITY): Payer: Medicare Other

## 2020-03-05 LAB — URINALYSIS, ROUTINE W REFLEX MICROSCOPIC
Bilirubin Urine: NEGATIVE
Glucose, UA: NEGATIVE mg/dL
Hgb urine dipstick: NEGATIVE
Ketones, ur: NEGATIVE mg/dL
Leukocytes,Ua: NEGATIVE
Nitrite: NEGATIVE
Protein, ur: NEGATIVE mg/dL
Specific Gravity, Urine: 1.018 (ref 1.005–1.030)
pH: 6 (ref 5.0–8.0)

## 2020-03-05 LAB — CBC
HCT: 42.7 % (ref 39.0–52.0)
Hemoglobin: 13.7 g/dL (ref 13.0–17.0)
MCH: 28 pg (ref 26.0–34.0)
MCHC: 32.1 g/dL (ref 30.0–36.0)
MCV: 87.1 fL (ref 80.0–100.0)
Platelets: 557 10*3/uL — ABNORMAL HIGH (ref 150–400)
RBC: 4.9 MIL/uL (ref 4.22–5.81)
RDW: 13.8 % (ref 11.5–15.5)
WBC: 7.9 10*3/uL (ref 4.0–10.5)
nRBC: 0 % (ref 0.0–0.2)

## 2020-03-05 LAB — BASIC METABOLIC PANEL
Anion gap: 13 (ref 5–15)
BUN: 12 mg/dL (ref 6–20)
CO2: 24 mmol/L (ref 22–32)
Calcium: 9.3 mg/dL (ref 8.9–10.3)
Chloride: 100 mmol/L (ref 98–111)
Creatinine, Ser: 0.62 mg/dL (ref 0.61–1.24)
GFR calc Af Amer: >60 mL/min (ref >60)
GFR calc non Af Amer: >60 mL/min (ref >60)
Glucose, Bld: 146 mg/dL — ABNORMAL HIGH (ref 70–99)
Potassium: 4.1 mmol/L (ref 3.5–5.1)
Sodium: 137 mmol/L (ref 135–145)

## 2020-03-05 LAB — GLUCOSE, CAPILLARY
Glucose-Capillary: 142 mg/dL — ABNORMAL HIGH (ref 70–99)
Glucose-Capillary: 153 mg/dL — ABNORMAL HIGH (ref 70–99)
Glucose-Capillary: 153 mg/dL — ABNORMAL HIGH (ref 70–99)
Glucose-Capillary: 154 mg/dL — ABNORMAL HIGH (ref 70–99)
Glucose-Capillary: 158 mg/dL — ABNORMAL HIGH (ref 70–99)
Glucose-Capillary: 168 mg/dL — ABNORMAL HIGH (ref 70–99)

## 2020-03-05 LAB — AMMONIA: Ammonia: 35 umol/L (ref 9–35)

## 2020-03-05 MED ORDER — CLONAZEPAM 0.25 MG PO TBDP
0.2500 mg | ORAL_TABLET | Freq: Two times a day (BID) | ORAL | Status: DC
  Administered 2020-03-05 – 2020-03-06 (×4): 0.25 mg
  Filled 2020-03-05 (×4): qty 1

## 2020-03-05 NOTE — NC FL2 (Signed)
[place] LEVEL OF CARE SCREENING TOOL     IDENTIFICATION  Patient Name: Phillip Barrett Birthdate: Sep 14, 1975 Sex: male Admission Date (Current Location): 02/25/2020  [place] and [place] Number:  [place] [facility] [id number] [place] and Address:  [facility], [place] [address] [place], [place] [address]      Provider Number: [id number]  Attending Physician Name and Address:  Md, Trauma, MD  Relative Name and Phone Number:  Dam Ashraf, father:  [phone]    Current Level of Care: Hospital Recommended Level of Care: [place] Prior Approval Number:    Date Approved/Denied:   PASRR Number: [id number]  Discharge Plan: SNF    Current Diagnoses: Patient Active Problem List   Diagnosis Date Noted  . SAH (subarachnoid hemorrhage) ([place]) 02/25/2020  . TBI (traumatic brain injury) ([place]) 02/25/2020  . Closed fracture of nasal bones   . Post concussion syndrome   . Confusion and disorientation 10/30/2019  . Falls 10/29/2019  . Polysubstance abuse ---Cocaine 10/29/2019  . Current smoker 11/23/2016  . Vitamin D deficiency 11/24/2014  . Hyperlipidemia 04/01/2014  . Obesity (BMI 30-39.9) 04/01/2014  . Type 2 diabetes mellitus not at goal [facility]) 03/09/2014    Orientation RESPIRATION BLADDER Height & Weight     Self  Normal Incontinent, External catheter Weight: 205 lb 4 oz (93.1 kg) Height:  5\' 11"  (180.3 cm)  BEHAVIORAL SYMPTOMS/MOOD NEUROLOGICAL BOWEL NUTRITION STATUS      Incontinent Diet (see discharge summary)  AMBULATORY STATUS COMMUNICATION OF NEEDS Skin   Extensive Assist Verbally Bruising, Skin abrasions (multiple abrasions; generalized ecchymosis and bruising)                       Personal Care Assistance Level of Assistance  Bathing, Feeding, Dressing Bathing Assistance: Maximum assistance Feeding assistance: Independent Dressing Assistance: Maximum assistance     Functional Limitations Info  Sight,  Hearing, Speech Sight Info: Adequate Hearing Info: Adequate Speech Info: Impaired    SPECIAL CARE FACTORS FREQUENCY  PT (By licensed PT), OT (By licensed OT)     PT Frequency: 5x week OT Frequency: 5x week            Contractures Contractures Info: Not present    Additional Factors Info  Code Status, Allergies, Psychotropic, Insulin Sliding Scale Code Status Info: Full Code Allergies Info: No Known Allergies Psychotropic Info: clonazePAM (KLONOPIN) disintegrating tablet 0.25 mg 2x daily; QUEtiapine (SEROQUEL) tablet 50 mg 2x daily Insulin Sliding Scale Info: insulin aspart (novoLOG) injection 0-15 Units every 4 hours       Current Medications (03/05/2020):  This is the current hospital active medication list Current Facility-Administered Medications  Medication Dose Route Frequency Provider Last Rate Last Admin  . acetaminophen (TYLENOL) suppository 650 mg  650 mg Rectal Q6H Lovick, Montel Culver, MD       Or  . acetaminophen (TYLENOL) tablet 1,000 mg  1,000 mg Per Tube Q6H Jesusita Oka, MD   1,000 mg at 03/05/20 0507  . chlorhexidine (PERIDEX) 0.12 % solution 15 mL  15 mL Mouth Rinse BID Georganna Skeans, MD   15 mL at 03/05/20 1002  . Chlorhexidine Gluconate Cloth 2 % PADS 6 each  6 each Topical Daily Georganna Skeans, MD   6 each at 03/04/20 1140  . clonazePAM (KLONOPIN) disintegrating tablet 0.25 mg  0.25 mg Per Tube BID Meuth, Brooke A, PA-C   0.25 mg at 03/05/20 0948  . docusate (COLACE) 50 MG/5ML liquid  100 mg  100 mg Per Tube BID Jesusita Oka, MD   100 mg at 03/05/20 0948  . feeding supplement (GLUCERNA 1.5 CAL) liquid 1,000 mL  1,000 mL Per Tube Continuous Saverio Danker, PA-C 65 mL/hr at 03/05/20 0041 1,000 mL at 03/05/20 0041  . feeding supplement (PROSource TF) liquid 45 mL  45 mL Per Tube BID Saverio Danker, PA-C   45 mL at 03/05/20 0949  . folic acid injection 1 mg  1 mg Intravenous Daily Jesusita Oka, MD   1 mg at 80/32/12 2482   Or  . folic acid (FOLVITE)  tablet 1 mg  1 mg Oral Daily Jesusita Oka, MD      . free water 150 mL  150 mL Per Tube Q4H Georganna Skeans, MD   150 mL at 03/05/20 0800  . guaiFENesin (ROBITUSSIN) 100 MG/5ML solution 100 mg  5 mL Oral Q4H PRN Saverio Danker, PA-C      . insulin aspart (novoLOG) injection 0-15 Units  0-15 Units Subcutaneous Q4H Georganna Skeans, MD   3 Units at 03/05/20 [phone]  . ipratropium-albuterol (DUONEB) 0.5-2.5 (3) MG/3ML nebulizer solution 3 mL  3 mL Nebulization Q2H PRN Georganna Skeans, MD   3 mL at 02/26/20 0429  . methocarbamol (ROBAXIN) 1,000 mg in dextrose 5 % 100 mL IVPB  1,000 mg Intravenous Q8H PRN Saverio Danker, PA-C      . metoprolol tartrate (LOPRESSOR) injection 2.5 mg  2.5 mg Intravenous Q6H Stark Klein, MD   2.5 mg at 03/05/20 0507  . metoprolol tartrate (LOPRESSOR) injection 5 mg  5 mg Intravenous Q6H PRN Jesusita Oka, MD      . multivitamin with minerals tablet 1 tablet  1 tablet Per Tube Daily Saverio Danker, PA-C   1 tablet at 03/05/20 0948  . nicotine (NICODERM CQ - dosed in mg/24 hours) patch 14 mg  14 mg Transdermal Daily Jesusita Oka, MD   14 mg at 03/05/20 0955  . ondansetron (ZOFRAN-ODT) disintegrating tablet 4 mg  4 mg Oral Q6H PRN Georganna Skeans, MD       Or  . ondansetron [facility]) injection 4 mg  4 mg Intravenous Q6H PRN Georganna Skeans, MD      . oxyCODONE (ROXICODONE) 5 MG/5ML solution 5 mg  5 mg Per Tube Q4H PRN Saverio Danker, PA-C      . QUEtiapine (SEROQUEL) tablet 50 mg  50 mg Per Tube BID Vallarie Mare, MD   50 mg at 03/05/20 0949  . sodium chloride flush (NS) 0.9 % injection 10-40 mL  10-40 mL Intracatheter Q12H Saverio Danker, PA-C   10 mL at 03/05/20 0958  . sodium chloride flush (NS) 0.9 % injection 10-40 mL  10-40 mL Intracatheter PRN Saverio Danker, PA-C      . thiamine tablet 100 mg  100 mg Oral Daily Jesusita Oka, MD   100 mg at 02/29/20 1125   Or  . thiamine (B-1) injection 100 mg  100 mg Intravenous Daily Jesusita Oka, MD   100 mg at  03/05/20 7048     Discharge Medications: Please see discharge summary for a list of discharge medications.  Relevant Imaging Results:  Relevant Lab Results:   Additional Information [id number] [place], [place]
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
## 2020-03-05 NOTE — Social Work (Signed)
Await advancement of diet/clarification of long term feeding plan to send out SNF referral, aware pt does not have support for CIR placement at this time. Pt cannot d/c w/ [facility]trak. TOC team continuing to follow for support with disposition when medically appropriate.  Westley Hummer, MSW, [place] Work

## 2020-03-06 LAB — CBC
HCT: 41.7 % (ref 39.0–52.0)
Hemoglobin: 13.4 g/dL (ref 13.0–17.0)
MCH: 28 pg (ref 26.0–34.0)
MCHC: 32.1 g/dL (ref 30.0–36.0)
MCV: 87.1 fL (ref 80.0–100.0)
Platelets: 607 10*3/uL — ABNORMAL HIGH (ref 150–400)
RBC: 4.79 MIL/uL (ref 4.22–5.81)
RDW: 13.8 % (ref 11.5–15.5)
WBC: 7.7 10*3/uL (ref 4.0–10.5)
nRBC: 0 % (ref 0.0–0.2)

## 2020-03-06 LAB — GLUCOSE, CAPILLARY
Glucose-Capillary: 151 mg/dL — ABNORMAL HIGH (ref 70–99)
Glucose-Capillary: 180 mg/dL — ABNORMAL HIGH (ref 70–99)
Glucose-Capillary: 162 mg/dL — ABNORMAL HIGH (ref 70–99)
Glucose-Capillary: 167 mg/dL — ABNORMAL HIGH (ref 70–99)
Glucose-Capillary: 154 mg/dL — ABNORMAL HIGH (ref 70–99)
Glucose-Capillary: 155 mg/dL — ABNORMAL HIGH (ref 70–99)

## 2020-03-06 LAB — BASIC METABOLIC PANEL
Anion gap: 10 (ref 5–15)
BUN: 16 mg/dL (ref 6–20)
CO2: 27 mmol/L (ref 22–32)
Calcium: 9.4 mg/dL (ref 8.9–10.3)
Chloride: 100 mmol/L (ref 98–111)
Creatinine, Ser: 0.6 mg/dL — ABNORMAL LOW (ref 0.61–1.24)
GFR calc Af Amer: >60 mL/min (ref >60)
GFR calc non Af Amer: >60 mL/min (ref >60)
Glucose, Bld: 138 mg/dL — ABNORMAL HIGH (ref 70–99)
Potassium: 4.3 mmol/L (ref 3.5–5.1)
Sodium: 137 mmol/L (ref 135–145)

## 2020-03-06 LAB — AMMONIA: Ammonia: 38 umol/L — ABNORMAL HIGH (ref 9–35)

## 2020-03-06 MED ORDER — LACTULOSE 10 GM/15ML PO SOLN
10.0000 g | Freq: Once | ORAL | Status: AC
  Administered 2020-03-06: 10 g
  Filled 2020-03-06: qty 30
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
## 2020-03-06 NOTE — TOC Progression Note (Signed)
Transition of Care [facility]) - Progression Note    Patient Details  Name: Phillip Barrett MRN: [id number] Date of Birth: [birth year]  [facility] of [facility]) CM/SW [place], [place] Phone Number: 03/06/2020, 2:30 PM  Clinical Narrative:    CSW attempted to contact patient's father Laszlo Ellerby. Left a voicemail, awaiting a call back.   Expected Discharge Plan: IP Rehab Facility Barriers to Discharge: Continued Medical Work up  Expected Discharge Plan and Services Expected Discharge Plan: [place]   Discharge Planning Services: CM Consult   Living arrangements for the past 2 months: Single Family Home                                       Social Determinants of Health (SDOH) Interventions    Readmission Risk Interventions No flowsheet data found.
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
## 2020-03-07 LAB — GLUCOSE, CAPILLARY
Glucose-Capillary: 178 mg/dL — ABNORMAL HIGH (ref 70–99)
Glucose-Capillary: 163 mg/dL — ABNORMAL HIGH (ref 70–99)
Glucose-Capillary: 176 mg/dL — ABNORMAL HIGH (ref 70–99)
Glucose-Capillary: 134 mg/dL — ABNORMAL HIGH (ref 70–99)
Glucose-Capillary: 185 mg/dL — ABNORMAL HIGH (ref 70–99)
Glucose-Capillary: 163 mg/dL — ABNORMAL HIGH (ref 70–99)

## 2020-03-07 LAB — URINE CULTURE: Culture: 80000 — AB

## 2020-03-07 LAB — AMMONIA: Ammonia: 53 umol/L — ABNORMAL HIGH (ref 9–35)

## 2020-03-07 MED ORDER — LACTULOSE 10 GM/15ML PO SOLN
10.0000 g | Freq: Two times a day (BID) | ORAL | Status: AC
  Administered 2020-03-07 (×2): 10 g
  Filled 2020-03-07 (×2): qty 30

## 2020-03-07 MED ORDER — SODIUM CHLORIDE 0.9 % IV SOLN
2.0000 g | INTRAVENOUS | Status: DC
  Administered 2020-03-07: 2 g via INTRAVENOUS
  Filled 2020-03-07 (×2): qty 20

## 2020-03-07 MED ORDER — CLONAZEPAM 0.25 MG PO TBDP
0.2500 mg | ORAL_TABLET | Freq: Every day | ORAL | Status: DC
  Administered 2020-03-07: 0.25 mg
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
## 2020-03-07 NOTE — Progress Notes (Signed)
Bed soaked Bath given and.Cleaned, Cream placed on bottom. New linen changed. Position up in bed. Did try to help with turning side to side.
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
## 2020-03-08 ENCOUNTER — Inpatient Hospital Stay (HOSPITAL_COMMUNITY)
Admission: RE | Admit: 2020-03-08 | Discharge: 2020-03-25 | DRG: 092 | Disposition: A | Discharge status: Skilled Nursing Facility | Payer: Medicare Other | Source: Intra-hospital | Attending: Physical Medicine & Rehabilitation | Admitting: Physical Medicine & Rehabilitation

## 2020-03-08 ENCOUNTER — Other Ambulatory Visit: Payer: Self-pay

## 2020-03-08 ENCOUNTER — Encounter (HOSPITAL_COMMUNITY): Payer: Self-pay | Admitting: Physical Medicine & Rehabilitation

## 2020-03-08 DIAGNOSIS — S066X9S Traumatic subarachnoid hemorrhage with loss of consciousness of unspecified duration, sequela: Secondary | ICD-10-CM | POA: Diagnosis present

## 2020-03-08 DIAGNOSIS — R000 Tachycardia, unspecified: Secondary | ICD-10-CM | POA: Diagnosis present

## 2020-03-08 DIAGNOSIS — R29810 Facial weakness: Secondary | ICD-10-CM | POA: Diagnosis present

## 2020-03-08 DIAGNOSIS — S0081XD Abrasion of other part of head, subsequent encounter: Secondary | ICD-10-CM

## 2020-03-08 DIAGNOSIS — F819 Developmental disorder of scholastic skills, unspecified: Secondary | ICD-10-CM | POA: Diagnosis present

## 2020-03-08 DIAGNOSIS — I10 Essential (primary) hypertension: Secondary | ICD-10-CM | POA: Diagnosis present

## 2020-03-08 DIAGNOSIS — S0240ED Zygomatic fracture, right side, subsequent encounter for fracture with routine healing: Secondary | ICD-10-CM | POA: Diagnosis not present

## 2020-03-08 DIAGNOSIS — Z8249 Family history of ischemic heart disease and other diseases of the circulatory system: Secondary | ICD-10-CM | POA: Diagnosis not present

## 2020-03-08 DIAGNOSIS — B379 Candidiasis, unspecified: Secondary | ICD-10-CM | POA: Diagnosis present

## 2020-03-08 DIAGNOSIS — Z7982 Long term (current) use of aspirin: Secondary | ICD-10-CM | POA: Diagnosis not present

## 2020-03-08 DIAGNOSIS — S069X6D Unspecified intracranial injury with loss of consciousness greater than 24 hours without return to pre-existing conscious level with patient surviving, subsequent encounter: Secondary | ICD-10-CM

## 2020-03-08 DIAGNOSIS — R0902 Hypoxemia: Secondary | ICD-10-CM | POA: Diagnosis present

## 2020-03-08 DIAGNOSIS — E785 Hyperlipidemia, unspecified: Secondary | ICD-10-CM | POA: Diagnosis present

## 2020-03-08 DIAGNOSIS — W19XXXD Unspecified fall, subsequent encounter: Secondary | ICD-10-CM | POA: Diagnosis present

## 2020-03-08 DIAGNOSIS — E7220 Disorder of urea cycle metabolism, unspecified: Secondary | ICD-10-CM | POA: Diagnosis present

## 2020-03-08 DIAGNOSIS — R4189 Other symptoms and signs involving cognitive functions and awareness: Secondary | ICD-10-CM | POA: Diagnosis present

## 2020-03-08 DIAGNOSIS — S065X9S Traumatic subdural hemorrhage with loss of consciousness of unspecified duration, sequela: Secondary | ICD-10-CM | POA: Diagnosis not present

## 2020-03-08 DIAGNOSIS — D473 Essential (hemorrhagic) thrombocythemia: Secondary | ICD-10-CM | POA: Diagnosis present

## 2020-03-08 DIAGNOSIS — S069X3S Unspecified intracranial injury with loss of consciousness of 1 hour to 5 hours 59 minutes, sequela: Secondary | ICD-10-CM | POA: Diagnosis not present

## 2020-03-08 DIAGNOSIS — S02631D Fracture of coronoid process of right mandible, subsequent encounter for fracture with routine healing: Secondary | ICD-10-CM

## 2020-03-08 DIAGNOSIS — W19XXXS Unspecified fall, sequela: Secondary | ICD-10-CM | POA: Diagnosis present

## 2020-03-08 DIAGNOSIS — R1312 Dysphagia, oropharyngeal phase: Secondary | ICD-10-CM | POA: Diagnosis present

## 2020-03-08 DIAGNOSIS — Z833 Family history of diabetes mellitus: Secondary | ICD-10-CM

## 2020-03-08 DIAGNOSIS — F17210 Nicotine dependence, cigarettes, uncomplicated: Secondary | ICD-10-CM | POA: Diagnosis present

## 2020-03-08 DIAGNOSIS — R471 Dysarthria and anarthria: Secondary | ICD-10-CM | POA: Diagnosis present

## 2020-03-08 DIAGNOSIS — E119 Type 2 diabetes mellitus without complications: Secondary | ICD-10-CM | POA: Diagnosis present

## 2020-03-08 DIAGNOSIS — R945 Abnormal results of liver function studies: Secondary | ICD-10-CM | POA: Diagnosis present

## 2020-03-08 DIAGNOSIS — S02609A Fracture of mandible, unspecified, initial encounter for closed fracture: Secondary | ICD-10-CM

## 2020-03-08 DIAGNOSIS — S069X0S Unspecified intracranial injury without loss of consciousness, sequela: Secondary | ICD-10-CM

## 2020-03-08 DIAGNOSIS — R531 Weakness: Secondary | ICD-10-CM | POA: Diagnosis present

## 2020-03-08 DIAGNOSIS — R400 Somnolence: Secondary | ICD-10-CM | POA: Diagnosis not present

## 2020-03-08 DIAGNOSIS — N390 Urinary tract infection, site not specified: Secondary | ICD-10-CM | POA: Diagnosis present

## 2020-03-08 DIAGNOSIS — Z20822 Contact with and (suspected) exposure to covid-19: Secondary | ICD-10-CM | POA: Diagnosis present

## 2020-03-08 DIAGNOSIS — Z794 Long term (current) use of insulin: Secondary | ICD-10-CM

## 2020-03-08 DIAGNOSIS — Z79899 Other long term (current) drug therapy: Secondary | ICD-10-CM

## 2020-03-08 DIAGNOSIS — R4689 Other symptoms and signs involving appearance and behavior: Secondary | ICD-10-CM | POA: Diagnosis not present

## 2020-03-08 DIAGNOSIS — I609 Nontraumatic subarachnoid hemorrhage, unspecified: Secondary | ICD-10-CM

## 2020-03-08 DIAGNOSIS — S0240CD Maxillary fracture, right side, subsequent encounter for fracture with routine healing: Secondary | ICD-10-CM

## 2020-03-08 DIAGNOSIS — B964 Proteus (mirabilis) (morganii) as the cause of diseases classified elsewhere: Secondary | ICD-10-CM | POA: Diagnosis present

## 2020-03-08 DIAGNOSIS — R7989 Other specified abnormal findings of blood chemistry: Secondary | ICD-10-CM

## 2020-03-08 DIAGNOSIS — D72828 Other elevated white blood cell count: Secondary | ICD-10-CM | POA: Diagnosis present

## 2020-03-08 DIAGNOSIS — R454 Irritability and anger: Secondary | ICD-10-CM | POA: Diagnosis not present

## 2020-03-08 DIAGNOSIS — R451 Restlessness and agitation: Secondary | ICD-10-CM | POA: Diagnosis present

## 2020-03-08 DIAGNOSIS — S069XAA Unspecified intracranial injury with loss of consciousness status unknown, initial encounter: Secondary | ICD-10-CM | POA: Diagnosis present

## 2020-03-08 DIAGNOSIS — Z741 Need for assistance with personal care: Secondary | ICD-10-CM | POA: Diagnosis present

## 2020-03-08 LAB — BASIC METABOLIC PANEL
Anion gap: 12 (ref 5–15)
BUN: 14 mg/dL (ref 6–20)
CO2: 24 mmol/L (ref 22–32)
Calcium: 9.5 mg/dL (ref 8.9–10.3)
Chloride: 100 mmol/L (ref 98–111)
Creatinine, Ser: 0.65 mg/dL (ref 0.61–1.24)
GFR calc Af Amer: >60 mL/min (ref >60)
GFR calc non Af Amer: >60 mL/min (ref >60)
Glucose, Bld: 210 mg/dL — ABNORMAL HIGH (ref 70–99)
Potassium: 4.3 mmol/L (ref 3.5–5.1)
Sodium: 136 mmol/L (ref 135–145)

## 2020-03-08 LAB — AMMONIA: Ammonia: 25 umol/L (ref 9–35)

## 2020-03-08 LAB — GLUCOSE, CAPILLARY
Glucose-Capillary: 212 mg/dL — ABNORMAL HIGH (ref 70–99)
Glucose-Capillary: 160 mg/dL — ABNORMAL HIGH (ref 70–99)
Glucose-Capillary: 157 mg/dL — ABNORMAL HIGH (ref 70–99)
Glucose-Capillary: 127 mg/dL — ABNORMAL HIGH (ref 70–99)
Glucose-Capillary: 153 mg/dL — ABNORMAL HIGH (ref 70–99)
Glucose-Capillary: 139 mg/dL — ABNORMAL HIGH (ref 70–99)

## 2020-03-08 MED ORDER — INSULIN ASPART 100 UNIT/ML ~~LOC~~ SOLN
0.0000 [IU] | SUBCUTANEOUS | Status: DC
  Administered 2020-03-08 (×2): 2 [IU] via SUBCUTANEOUS
  Administered 2020-03-09: 3 [IU] via SUBCUTANEOUS
  Administered 2020-03-09: 2 [IU] via SUBCUTANEOUS
  Administered 2020-03-09 (×2): 3 [IU] via SUBCUTANEOUS
  Administered 2020-03-09: 5 [IU] via SUBCUTANEOUS
  Administered 2020-03-09: 2 [IU] via SUBCUTANEOUS
  Administered 2020-03-10 (×2): 3 [IU] via SUBCUTANEOUS
  Administered 2020-03-10 (×2): 2 [IU] via SUBCUTANEOUS
  Administered 2020-03-10: 3 [IU] via SUBCUTANEOUS
  Administered 2020-03-10: 2 [IU] via SUBCUTANEOUS
  Administered 2020-03-11: 3 [IU] via SUBCUTANEOUS
  Administered 2020-03-11: 2 [IU] via SUBCUTANEOUS
  Administered 2020-03-11: 3 [IU] via SUBCUTANEOUS
  Administered 2020-03-12: 5 [IU] via SUBCUTANEOUS
  Administered 2020-03-12 – 2020-03-13 (×3): 3 [IU] via SUBCUTANEOUS
  Administered 2020-03-13: 2 [IU] via SUBCUTANEOUS
  Administered 2020-03-13 – 2020-03-14 (×2): 3 [IU] via SUBCUTANEOUS
  Administered 2020-03-14 (×2): 2 [IU] via SUBCUTANEOUS
  Administered 2020-03-14: 3 [IU] via SUBCUTANEOUS
  Administered 2020-03-15 (×2): 2 [IU] via SUBCUTANEOUS
  Administered 2020-03-15: 3 [IU] via SUBCUTANEOUS
  Administered 2020-03-15 – 2020-03-16 (×4): 2 [IU] via SUBCUTANEOUS
  Administered 2020-03-17: 3 [IU] via SUBCUTANEOUS

## 2020-03-08 MED ORDER — TRAZODONE HCL 50 MG PO TABS
25.0000 mg | ORAL_TABLET | Freq: Every evening | ORAL | Status: DC | PRN
  Administered 2020-03-10 – 2020-03-13 (×4): 50 mg
  Filled 2020-03-08 (×4): qty 1

## 2020-03-08 MED ORDER — GLUCERNA 1.5 CAL PO LIQD
1000.0000 mL | ORAL | Status: DC
  Administered 2020-03-08 – 2020-03-09 (×2): 1000 mL
  Filled 2020-03-08 (×2): qty 1000

## 2020-03-08 MED ORDER — SODIUM CHLORIDE 0.9% FLUSH
10.0000 mL | Freq: Two times a day (BID) | INTRAVENOUS | Status: DC
  Administered 2020-03-09 – 2020-03-12 (×7): 10 mL

## 2020-03-08 MED ORDER — CHLORHEXIDINE GLUCONATE 0.12 % MT SOLN: 15.0000 mL | Freq: Two times a day (BID) | OROMUCOSAL | Status: DC

## 2020-03-08 MED ORDER — BISACODYL 10 MG RE SUPP: 10.0000 mg | Freq: Every day | RECTAL | Status: DC | PRN

## 2020-03-08 MED ORDER — PROSOURCE TF PO LIQD
45.0000 mL | Freq: Two times a day (BID) | ORAL | Status: DC
  Administered 2020-03-08 – 2020-03-15 (×14): 45 mL
  Filled 2020-03-08 (×15): qty 45

## 2020-03-08 MED ORDER — FREE WATER
150.0000 mL | Status: DC
  Administered 2020-03-08 – 2020-03-17 (×54): 150 mL

## 2020-03-08 MED ORDER — GUAIFENESIN 100 MG/5ML PO SOLN: 5.0000 mL | ORAL | Status: DC | PRN

## 2020-03-08 MED ORDER — PROCHLORPERAZINE 25 MG RE SUPP: 12.5000 mg | Freq: Four times a day (QID) | RECTAL | Status: DC | PRN

## 2020-03-08 MED ORDER — QUETIAPINE FUMARATE 25 MG PO TABS
25.0000 mg | ORAL_TABLET | Freq: Two times a day (BID) | ORAL | Status: DC
  Administered 2020-03-08: 25 mg
  Filled 2020-03-08: qty 1

## 2020-03-08 MED ORDER — OXYCODONE HCL 5 MG/5ML PO SOLN
5.0000 mg | ORAL | Status: DC | PRN
  Administered 2020-03-08 – 2020-03-13 (×4): 5 mg
  Filled 2020-03-08 (×5): qty 5

## 2020-03-08 MED ORDER — PROCHLORPERAZINE MALEATE 5 MG PO TABS
5.0000 mg | ORAL_TABLET | Freq: Four times a day (QID) | ORAL | Status: DC | PRN
  Administered 2020-03-19: 10 mg
  Filled 2020-03-08: qty 2

## 2020-03-08 MED ORDER — IPRATROPIUM-ALBUTEROL 0.5-2.5 (3) MG/3ML IN SOLN: 3.0000 mL | RESPIRATORY_TRACT | Status: DC | PRN

## 2020-03-08 MED ORDER — GUAIFENESIN-DM 100-10 MG/5ML PO SYRP: 5.0000 mL | ORAL_SOLUTION | Freq: Four times a day (QID) | ORAL | Status: DC | PRN

## 2020-03-08 MED ORDER — GLUCERNA 1.5 CAL PO LIQD
1000.0000 mL | ORAL | Status: DC
  Administered 2020-03-08: 1000 mL
  Filled 2020-03-08 (×2): qty 1000

## 2020-03-08 MED ORDER — CEFAZOLIN SODIUM-DEXTROSE 1-4 GM/50ML-% IV SOLN
1.0000 g | Freq: Three times a day (TID) | INTRAVENOUS | Status: DC
  Administered 2020-03-08: 1 g via INTRAVENOUS
  Filled 2020-03-08 (×2): qty 50

## 2020-03-08 MED ORDER — INSULIN ASPART 100 UNIT/ML ~~LOC~~ SOLN
3.0000 [IU] | Freq: Four times a day (QID) | SUBCUTANEOUS | Status: DC
  Administered 2020-03-08 – 2020-03-12 (×14): 3 [IU] via SUBCUTANEOUS

## 2020-03-08 MED ORDER — ADULT MULTIVITAMIN W/MINERALS CH
1.0000 | ORAL_TABLET | Freq: Every day | ORAL | Status: DC
  Administered 2020-03-09 – 2020-03-18 (×10): 1
  Filled 2020-03-08 (×10): qty 1

## 2020-03-08 MED ORDER — NICOTINE 14 MG/24HR TD PT24: 14.0000 mg | MEDICATED_PATCH | Freq: Every day | TRANSDERMAL | Status: DC

## 2020-03-08 MED ORDER — CHLORHEXIDINE GLUCONATE CLOTH 2 % EX PADS
6.0000 | MEDICATED_PAD | Freq: Every day | CUTANEOUS | Status: DC
  Administered 2020-03-09: 6 via TOPICAL

## 2020-03-08 MED ORDER — CEFAZOLIN SODIUM-DEXTROSE 1-4 GM/50ML-% IV SOLN
1.0000 g | Freq: Three times a day (TID) | INTRAVENOUS | Status: DC
  Administered 2020-03-08 – 2020-03-10 (×5): 1 g via INTRAVENOUS
  Filled 2020-03-08 (×6): qty 50

## 2020-03-08 MED ORDER — ORAL CARE MOUTH RINSE
15.0000 mL | Freq: Two times a day (BID) | OROMUCOSAL | Status: DC
  Administered 2020-03-08 – 2020-03-16 (×15): 15 mL via OROMUCOSAL

## 2020-03-08 MED ORDER — METOPROLOL TARTRATE 25 MG/10 ML ORAL SUSPENSION
12.5000 mg | Freq: Every day | ORAL | Status: DC
  Administered 2020-03-08 – 2020-03-18 (×11): 12.5 mg
  Filled 2020-03-08 (×11): qty 5

## 2020-03-08 MED ORDER — SODIUM CHLORIDE 0.9% FLUSH: 10.0000 mL | INTRAVENOUS | Status: DC | PRN

## 2020-03-08 MED ORDER — CLONAZEPAM 0.25 MG PO TBDP
0.2500 mg | ORAL_TABLET | Freq: Three times a day (TID) | ORAL | Status: DC | PRN
  Administered 2020-03-10 – 2020-03-17 (×7): 0.25 mg
  Filled 2020-03-08 (×9): qty 1

## 2020-03-08 MED ORDER — ALUM & MAG HYDROXIDE-SIMETH 200-200-20 MG/5ML PO SUSP: 30.0000 mL | ORAL | Status: DC | PRN

## 2020-03-08 MED ORDER — PROCHLORPERAZINE EDISYLATE 10 MG/2ML IJ SOLN: 5.0000 mg | Freq: Four times a day (QID) | INTRAMUSCULAR | Status: DC | PRN

## 2020-03-08 MED ORDER — DIPHENHYDRAMINE HCL 12.5 MG/5ML PO ELIX
12.5000 mg | ORAL_SOLUTION | Freq: Four times a day (QID) | ORAL | Status: DC | PRN
  Administered 2020-03-19: 25 mg
  Filled 2020-03-08: qty 10

## 2020-03-08 MED ORDER — CHLORHEXIDINE GLUCONATE 0.12 % MT SOLN
15.0000 mL | Freq: Two times a day (BID) | OROMUCOSAL | Status: DC
  Administered 2020-03-08 – 2020-03-25 (×28): 15 mL via OROMUCOSAL
  Filled 2020-03-08 (×35): qty 15

## 2020-03-08 MED ORDER — FOLIC ACID 1 MG PO TABS
1.0000 mg | ORAL_TABLET | Freq: Every day | ORAL | Status: DC
  Administered 2020-03-08 – 2020-03-18 (×11): 1 mg
  Filled 2020-03-08 (×11): qty 1

## 2020-03-08 MED ORDER — FLEET ENEMA 7-19 GM/118ML RE ENEM: 1.0000 | ENEMA | Freq: Once | RECTAL | Status: DC | PRN

## 2020-03-08 MED ORDER — QUETIAPINE FUMARATE 25 MG PO TABS
25.0000 mg | ORAL_TABLET | Freq: Two times a day (BID) | ORAL | Status: DC
  Administered 2020-03-08 – 2020-03-10 (×4): 25 mg
  Filled 2020-03-08 (×4): qty 1

## 2020-03-08 MED ORDER — THIAMINE HCL 100 MG PO TABS
100.0000 mg | ORAL_TABLET | Freq: Every day | ORAL | Status: DC
  Administered 2020-03-09 – 2020-03-18 (×10): 100 mg
  Filled 2020-03-08 (×10): qty 1

## 2020-03-08 MED ORDER — NICOTINE 21 MG/24HR TD PT24
21.0000 mg | MEDICATED_PATCH | Freq: Every day | TRANSDERMAL | Status: DC
  Administered 2020-03-09 – 2020-03-25 (×17): 21 mg via TRANSDERMAL
  Filled 2020-03-08 (×18): qty 1

## 2020-03-08 MED ORDER — LACTULOSE 10 GM/15ML PO SOLN
10.0000 g | Freq: Every day | ORAL | Status: DC
  Administered 2020-03-08 – 2020-03-18 (×11): 10 g
  Filled 2020-03-08 (×11): qty 15

## 2020-03-08 NOTE — TOC Transition Note (Signed)
Transition of Care [facility]) - CM/SW Discharge Note   Patient Details  Name: Phillip Barrett MRN: [id number] Date of Birth: 03-25-1976  [facility]) CM/SW Contact:  Ella Bodo, RN Phone Number: 03/08/2020, 2:15 PM   Clinical Narrative:   Patient medically stable for discharge today, and has been accepted for admission to [facility] inpatient rehab.  Plan discharge to CIR upon bed availability.    Final next level of care: IP Rehab Facility Barriers to Discharge: Barriers Resolved                         Discharge Plan and Services   Discharge Planning Services: CM Consult Post Acute Care Choice: IP Rehab                               Social Determinants of Health (SDOH) Interventions     Readmission Risk Interventions No flowsheet data found.  Reinaldo Raddle, RN, BSN  Trauma/Neuro ICU Case Manager [phone]
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
## 2020-03-08 NOTE — Progress Notes (Signed)
Pt arrived to the floor and assisted into bed. Skin check performed with [facility] LPN. Pt oriented to floor and therapy routine. Call bell in reach
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
## 2020-03-08 NOTE — Progress Notes (Signed)
IP rehab admissions:  I met with inpatient rehab team today.  Dr. Naaman Plummer feels that CIR would be good for patient and that we can place in SNF after CIR as needed.  I have talked to trauma team and have medical clearance for CIR admit today.  I talked with patient and explained as best I could.  I will call patient's dad and discuss decision with him as well.  Bed available on CIR and will admit to CIR today.  Call me for questions.  [phone]
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
## 2020-03-08 NOTE — Plan of Care (Signed)
  Problem: Consults Goal: RH BRAIN INJURY PATIENT EDUCATION Description: Description: See Patient Education module for eduction specifics Outcome: Progressing Goal: Nutrition Consult-if indicated Outcome: Progressing   Problem: RH BOWEL ELIMINATION Goal: RH STG MANAGE BOWEL WITH ASSISTANCE Description: STG Manage Bowel with min Assistance. Outcome: Progressing   Problem: RH BLADDER ELIMINATION Goal: RH STG MANAGE BLADDER WITH ASSISTANCE Description: STG Manage Bladder With min Assistance Outcome: Progressing Goal: RH STG MANAGE BLADDER WITH EQUIPMENT WITH ASSISTANCE Description: STG Manage Bladder With Equipment With min Assistance Outcome: Progressing   Problem: RH SKIN INTEGRITY Goal: RH STG SKIN FREE OF INFECTION/BREAKDOWN Description: Skin will remain free of infection/breakdown with min assist Outcome: Progressing Goal: RH STG MAINTAIN SKIN INTEGRITY WITH ASSISTANCE Description: STG Maintain Skin Integrity With min Assistance. Outcome: Progressing   Problem: RH SAFETY Goal: RH STG ADHERE TO SAFETY PRECAUTIONS W/ASSISTANCE/DEVICE Description: STG Adhere to Safety Precautions With min Assistance/Device. Outcome: Progressing   Problem: RH COGNITION-NURSING Goal: RH STG USES MEMORY AIDS/STRATEGIES W/ASSIST TO PROBLEM SOLVE Description: STG Uses Memory Aids/Strategies With min Assistance to Problem Solve. Outcome: Progressing   Problem: RH PAIN MANAGEMENT Goal: RH STG PAIN MANAGED AT OR BELOW PT'S PAIN GOAL Description: Pt will be able to verbalize pain less than 3 out of 10 with min assist  Outcome: Progressing   Problem: RH KNOWLEDGE DEFICIT BRAIN INJURY Goal: RH STG INCREASE KNOWLEDGE OF SELF CARE AFTER BRAIN INJURY Description: Pt and family will be able to identify 3 ways to improve pt safety awareness when returning home with min assist.  Outcome: Progressing   

## 2020-03-08 NOTE — Progress Notes (Signed)
Inpatient Rehabilitation Medication Review by a Pharmacist  A complete drug regimen review was completed for this patient to identify any potential clinically significant medication issues.  Clinically significant medication issues were identified:  no   Type of Medication Issue Identified Description of Issue Urgent (address now) Non-Urgent (address on AM team rounds) Plan Plan Accepted by Provider? (Yes / No / Pending AM Rounds)  Drug Interaction(s) (clinically significant)       Duplicate Therapy       Allergy       No Medication Administration End Date       Incorrect Dose       Additional Drug Therapy Needed       Other  Simvastatin and Metformin PTA (unclear if patient actually taking these) Non-Urgent F/U with team    For non-urgent medication issues to be resolved on team rounds tomorrow morning a [facility] Secure Chat Handoff was sent to:    Pharmacist comments: Patient states he has been taking these but refill history doesn't support this. Simvastation - Patient states is currently on medication and has taken in the past week but unsure of exact date; patient states that [facility] is pharmacy of choice but they have not filled for him since 09/2018.  F/U with team to see if they want to continue or not.   Metformin - Prescription from 2018. No evidence of being filled in the last 6 months.  Looks like he is on insulin - not sure if they will want to add this at discharge.  Time spent performing this drug regimen review (minutes):  30min.   Tomasita Morrow 03/08/2020 8:15 PM
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
## 2020-03-08 NOTE — Progress Notes (Signed)
Transported with nurses. Report called to [place], [place].
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
## 2020-03-09 ENCOUNTER — Inpatient Hospital Stay (HOSPITAL_COMMUNITY): Payer: Medicare Other

## 2020-03-09 DIAGNOSIS — R1312 Dysphagia, oropharyngeal phase: Secondary | ICD-10-CM

## 2020-03-09 DIAGNOSIS — R4689 Other symptoms and signs involving appearance and behavior: Secondary | ICD-10-CM

## 2020-03-09 DIAGNOSIS — S069X3S Unspecified intracranial injury with loss of consciousness of 1 hour to 5 hours 59 minutes, sequela: Secondary | ICD-10-CM

## 2020-03-09 DIAGNOSIS — E119 Type 2 diabetes mellitus without complications: Secondary | ICD-10-CM

## 2020-03-09 DIAGNOSIS — S069X0S Unspecified intracranial injury without loss of consciousness, sequela: Secondary | ICD-10-CM

## 2020-03-09 LAB — CBC WITH DIFFERENTIAL/PLATELET
Abs Immature Granulocytes: 0.02 10*3/uL (ref 0.00–0.07)
Basophils Absolute: 0.1 10*3/uL (ref 0.0–0.1)
Basophils Relative: 1 %
Eosinophils Absolute: 0.2 10*3/uL (ref 0.0–0.5)
Eosinophils Relative: 2 %
HCT: 45 % (ref 39.0–52.0)
Hemoglobin: 14.3 g/dL (ref 13.0–17.0)
Immature Granulocytes: 0 %
Lymphocytes Relative: 25 %
Lymphs Abs: 1.9 10*3/uL (ref 0.7–4.0)
MCH: 27.7 pg (ref 26.0–34.0)
MCHC: 31.8 g/dL (ref 30.0–36.0)
MCV: 87 fL (ref 80.0–100.0)
Monocytes Absolute: 0.6 10*3/uL (ref 0.1–1.0)
Monocytes Relative: 7 %
Neutro Abs: 4.9 10*3/uL (ref 1.7–7.7)
Neutrophils Relative %: 65 %
Platelets: 610 10*3/uL — ABNORMAL HIGH (ref 150–400)
RBC: 5.17 MIL/uL (ref 4.22–5.81)
RDW: 13.7 % (ref 11.5–15.5)
WBC: 7.6 10*3/uL (ref 4.0–10.5)
nRBC: 0 % (ref 0.0–0.2)

## 2020-03-09 LAB — COMPREHENSIVE METABOLIC PANEL
ALT: 71 U/L — ABNORMAL HIGH (ref 0–44)
AST: 49 U/L — ABNORMAL HIGH (ref 15–41)
Albumin: 3 g/dL — ABNORMAL LOW (ref 3.5–5.0)
Alkaline Phosphatase: 75 U/L (ref 38–126)
Anion gap: 12 (ref 5–15)
BUN: 18 mg/dL (ref 6–20)
CO2: 25 mmol/L (ref 22–32)
Calcium: 9.5 mg/dL (ref 8.9–10.3)
Chloride: 100 mmol/L (ref 98–111)
Creatinine, Ser: 0.68 mg/dL (ref 0.61–1.24)
GFR calc Af Amer: >60 mL/min (ref >60)
GFR calc non Af Amer: >60 mL/min (ref >60)
Glucose, Bld: 143 mg/dL — ABNORMAL HIGH (ref 70–99)
Potassium: 4.7 mmol/L (ref 3.5–5.1)
Sodium: 137 mmol/L (ref 135–145)
Total Bilirubin: 0.4 mg/dL (ref 0.3–1.2)
Total Protein: 7.7 g/dL (ref 6.5–8.1)

## 2020-03-09 LAB — GLUCOSE, CAPILLARY
Glucose-Capillary: 137 mg/dL — ABNORMAL HIGH (ref 70–99)
Glucose-Capillary: 145 mg/dL — ABNORMAL HIGH (ref 70–99)
Glucose-Capillary: 154 mg/dL — ABNORMAL HIGH (ref 70–99)
Glucose-Capillary: 165 mg/dL — ABNORMAL HIGH (ref 70–99)
Glucose-Capillary: 173 mg/dL — ABNORMAL HIGH (ref 70–99)
Glucose-Capillary: 174 mg/dL — ABNORMAL HIGH (ref 70–99)
Glucose-Capillary: 219 mg/dL — ABNORMAL HIGH (ref 70–99)

## 2020-03-09 LAB — AMMONIA: Ammonia: 39 umol/L — ABNORMAL HIGH (ref 9–35)

## 2020-03-09 MED ORDER — GLUCERNA 1.5 CAL PO LIQD
1000.0000 mL | ORAL | Status: DC
  Administered 2020-03-09 – 2020-03-14 (×5): 1000 mL
  Filled 2020-03-09 (×14): qty 1000
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
## 2020-03-09 NOTE — Progress Notes (Signed)
Patient ID: Phillip Barrett, male   DOB: Oct 25, 1975, 44 y.o.   MRN: [id number]  SW made efforts to meet with pt in room but pt leaving for therapy. SW to follow-up later. SW briefly asked pt if his father was the best person to contact, and pt agreed.   SW called pt father Thayer Jew [phone]) to discussed pt care needs and d/c plan. He continues to support that pt will need to be SNF level of care at d/c, and will not be able to return to housing where he was. He states that pt has rep payee through social security and he requested that his month-to-month apartment rent be discontinued. He states pt will need to be in SNF until he is physically able to do more for himself, in which he is open to providing care to him at his home in [place]. Through discussion, pt father is not aware of any of his substance abuse hx. SW informed pt father on d/c process, and will follow-up with more updates after team conference on Tuesday.   SW made an effort to meet with pt again, however, pt sleepy and not able to communicate with SW. SW will make another attempt.   Loralee Pacas, MSW, [place] Office: [phone] Cell: [phone] Fax: [phone]
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
## 2020-03-09 NOTE — Progress Notes (Signed)
Inpatient Rehabilitation  Patient information reviewed and entered into eRehab system by Dniyah Grant M. Jazmaine Fuelling, M.A., CCC/SLP, PPS Coordinator.  Information including medical coding, functional ability and quality indicators will be reviewed and updated through discharge.    
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
## 2020-03-09 NOTE — Plan of Care (Signed)
  Problem: Consults Goal: RH BRAIN INJURY PATIENT EDUCATION Description: Description: See Patient Education module for eduction specifics Outcome: Progressing Goal: Nutrition Consult-if indicated Outcome: Progressing   Problem: RH BOWEL ELIMINATION Goal: RH STG MANAGE BOWEL WITH ASSISTANCE Description: STG Manage Bowel with min Assistance. Outcome: Progressing   Problem: RH BLADDER ELIMINATION Goal: RH STG MANAGE BLADDER WITH ASSISTANCE Description: STG Manage Bladder With min Assistance Outcome: Progressing Goal: RH STG MANAGE BLADDER WITH EQUIPMENT WITH ASSISTANCE Description: STG Manage Bladder With Equipment With min Assistance Outcome: Progressing   Problem: RH SKIN INTEGRITY Goal: RH STG SKIN FREE OF INFECTION/BREAKDOWN Description: Skin will remain free of infection/breakdown with min assist Outcome: Progressing Goal: RH STG MAINTAIN SKIN INTEGRITY WITH ASSISTANCE Description: STG Maintain Skin Integrity With min Assistance. Outcome: Progressing   Problem: RH SAFETY Goal: RH STG ADHERE TO SAFETY PRECAUTIONS W/ASSISTANCE/DEVICE Description: STG Adhere to Safety Precautions With min Assistance/Device. Outcome: Progressing   Problem: RH COGNITION-NURSING Goal: RH STG USES MEMORY AIDS/STRATEGIES W/ASSIST TO PROBLEM SOLVE Description: STG Uses Memory Aids/Strategies With min Assistance to Problem Solve. Outcome: Progressing   Problem: RH PAIN MANAGEMENT Goal: RH STG PAIN MANAGED AT OR BELOW PT'S PAIN GOAL Description: Pt will be able to verbalize pain less than 3 out of 10 with min assist  Outcome: Progressing   Problem: RH KNOWLEDGE DEFICIT BRAIN INJURY Goal: RH STG INCREASE KNOWLEDGE OF SELF CARE AFTER BRAIN INJURY Description: Pt and family will be able to identify 3 ways to improve pt safety awareness when returning home with min assist.  Outcome: Progressing

## 2020-03-10 ENCOUNTER — Inpatient Hospital Stay (HOSPITAL_COMMUNITY): Payer: Medicare Other | Admitting: Speech Pathology

## 2020-03-10 ENCOUNTER — Inpatient Hospital Stay (HOSPITAL_COMMUNITY): Payer: Medicare Other

## 2020-03-10 DIAGNOSIS — R7989 Other specified abnormal findings of blood chemistry: Secondary | ICD-10-CM

## 2020-03-10 LAB — GLUCOSE, CAPILLARY
Glucose-Capillary: 148 mg/dL — ABNORMAL HIGH (ref 70–99)
Glucose-Capillary: 191 mg/dL — ABNORMAL HIGH (ref 70–99)
Glucose-Capillary: 188 mg/dL — ABNORMAL HIGH (ref 70–99)
Glucose-Capillary: 138 mg/dL — ABNORMAL HIGH (ref 70–99)
Glucose-Capillary: 126 mg/dL — ABNORMAL HIGH (ref 70–99)

## 2020-03-10 MED ORDER — QUETIAPINE FUMARATE 25 MG PO TABS
25.0000 mg | ORAL_TABLET | Freq: Every day | ORAL | Status: DC
  Administered 2020-03-10 – 2020-03-17 (×8): 25 mg
  Filled 2020-03-10 (×8): qty 1

## 2020-03-10 MED ORDER — CEPHALEXIN 250 MG/5ML PO SUSR
250.0000 mg | Freq: Three times a day (TID) | ORAL | Status: DC
  Administered 2020-03-10 – 2020-03-17 (×19): 250 mg
  Filled 2020-03-10 (×25): qty 5

## 2020-03-10 MED ORDER — FLUCONAZOLE 100 MG PO TABS
100.0000 mg | ORAL_TABLET | Freq: Every day | ORAL | Status: AC
  Administered 2020-03-10 – 2020-03-12 (×3): 100 mg
  Filled 2020-03-10 (×3): qty 1

## 2020-03-10 MED ORDER — METHYLPHENIDATE HCL 5 MG PO TABS
5.0000 mg | ORAL_TABLET | Freq: Two times a day (BID) | ORAL | Status: DC
  Administered 2020-03-10 – 2020-03-14 (×7): 5 mg via NASOGASTRIC
  Filled 2020-03-10 (×7): qty 1

## 2020-03-10 NOTE — Plan of Care (Addendum)
Behavioral Plan   Rancho Level: Rancho VI  Behavior to decrease/ eliminate: fall/mitigate high fall risk, restlessness, pain from buttocks hurting, agitation due to perceived missing phone (has one but thinks he had 2).   Changes to environment:  - Block HOB from going down below 30 degrees  -tv allowed to be on in room  Interventions: - TIS w/c for sitting OOB for prolonged periods of time-trial shorter times OOB then progress NO RECLINER  - Encourage scooting up in bed with cues for proper positioning with tube feed/secretions  - encourage suction  - oral care 4x per day  -belt alarm when out of bed   -middle setting on bed alarm   -patient does not like to have the phone out of his sight. Allow him to bring it to therapy but would not allow him to use it.   Recommendations for interactions with patient:   Attendees: [name] SLP [place] PT Cherry M RN

## 2020-03-10 NOTE — Care Management (Signed)
[facility] Individual Statement of Services  Patient Name:  Phillip Barrett  Date:  03/10/2020  Welcome to the [facility].  Our goal is to provide you with an individualized program based on your diagnosis and situation, designed to meet your specific needs.  With this comprehensive rehabilitation program, you will be expected to participate in at least 3 hours of rehabilitation therapies Monday-Friday, with modified therapy programming on the weekends.  Your rehabilitation program will include the following services:  Physical Therapy (PT), Occupational Therapy (OT), Speech Therapy (ST), 24 hour per day rehabilitation nursing, Therapeutic Recreaction (TR), Psychology, Neuropsychology, Care Coordinator, Rehabilitation Medicine, Nutrition Services, Pharmacy Services and Other  Weekly team conferences will be held on Tuesdays to discuss your progress.  Your Inpatient Rehabilitation Care Coordinator will talk with you frequently to get your input and to update you on team discussions.  Team conferences with you and your family in attendance may also be held.  Expected length of stay: 3-4 weeks   Overall anticipated outcome: Supervision  Depending on your progress and recovery, your program may change. Your Inpatient Rehabilitation Care Coordinator will coordinate services and will keep you informed of any changes. Your Inpatient Rehabilitation Care Coordinator's name and contact numbers are listed  below.  The following services may also be recommended but are not provided by the [facility] will be made to provide these services after discharge if needed.  Arrangements include referral to agencies that provide these services.  Your insurance has been verified to be:  Medicare A/B and  Medicaid  Your primary doctor is:  Evelina Dun  Pertinent information will be shared with your doctor and your insurance company.  Inpatient Rehabilitation Care Coordinator:  Cathleen Corti [phone] or ([phone]  Information discussed with and copy given to patient by: Rana Snare, 03/10/2020, 10:31 AM

## 2020-03-10 NOTE — Progress Notes (Signed)
Speech Language Pathology Daily Session Note  Patient Details  Name: Phillip Barrett MRN: [id number] Date of Birth: 1976-05-14  Today's Date: 03/10/2020 SLP Individual Time: 0925-1020 SLP Individual Time Calculation (min): 55 min  Short Term Goals: Week 1: SLP Short Term Goal 1 (Week 1): Pt will consume trials of thin liquids via TSP with minimal overt s/s aspiration to indicate readiness of repeat MBS. SLP Short Term Goal 2 (Week 1): Pt will consume trials of puree textures with oral clearance and minimial overt s/s aspiration to indictate readiness for repeat MBS. SLP Short Term Goal 3 (Week 1): Pt will participate in oropharyngeal exercises/RMT to improve swallow function prior to repeat MBS with mod A verbal cues. SLP Short Term Goal 4 (Week 1): Pt will utilizie speech intelligibility strategies at the phrase level to produce 60% intelligbility with max A verbal cues. SLP Short Term Goal 5 (Week 1): Pt will demonstrate orientation to place, time and situtation with max A verbal cues. SLP Short Term Goal 6 (Week 1): Pt will demonstrate sustained attention in functional tasks in 10 minute intervals with min A verbal cues for redirection.  Skilled Therapeutic Interventions: Skilled treatment session focused on dysphagia and speech goals. Upon arrival, patient was lethargic and had consistent coughing throughout session, suspect due to decreased management of secretions. Patient also with poor positioning in bed and required constant repositioning due to restlessness. SLP provided extensive oral care and required Max A multimodal cues for initiation of EMST. Patient performed 2 repetitions of EMST at 5 cm H2O but unable to rate effort level. Official RMT testing was not administered due to cognitive impairments. Due to fatigue, restlessness and constant coughing due to poor secretion management, trials were not administered. Patient with minimal verbal output and was ~50% intelligible at the phrase  level with Max A multimodal cues. Patient left upright in bed with alarm on and all needs within reach. Continue with current plan of care.       Pain No/Denies Pain   Therapy/Group: Individual Therapy  Tassie Pollett 03/10/2020, 2:54 PM
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
## 2020-03-10 NOTE — Progress Notes (Signed)
Physical Therapy Session Note  Patient Details  Name: Phillip Barrett MRN: [id number] Date of Birth: 1975-08-10  Today's Date: 03/10/2020 PT Individual Time: 1115-1145 and 1430-1530 PT Individual Time Calculation (min): 30 min and 60 min  Short Term Goals: Week 1:  PT Short Term Goal 1 (Week 1): Patient will perform basic transfers with mod A consistently. PT Short Term Goal 2 (Week 1): Patient will perform standing balance >2 min during functional activities with min A. PT Short Term Goal 3 (Week 1): Patient will ambulated >20 ft using LRAD with mod A.  Skilled Therapeutic Interventions/Progress Updates:     Session 1: Patient in bed upon PT arrival. Patient alert and agreeable to PT session, requesting to void at beginning of session. Patient denied pain during session. Paused tube feed during mobility, RN made aware.  Therapeutic Activity: Bed Mobility: Patient performed supine to sit with min A for trunk support and sit to supine with supervision with HOB elevated and use of bed rails. Provided verbal cues for bringing knees to chest to lift LEs onto the bed and lateral trunk activation to come to sitting EOB. Transfers: Patient performed sit to/from stand x4 in the [place] with min A-CGA. Provided verbal cues and facilitation for foot placement and forward weight shift. Patient was continent of bladder during session. Required total A for peri-care and LB clothing management during toileting.   Patient in bed at end of session with breaks locked, bed alarm set, and all needs within reach. Required increased time due to equipment management and slow initiation by patient for all mobility. Resumed tube feed at end of session.  Session 2: Patient in bed upon PT arrival. Patient mildly lethargic due to fatigue, but agreeable to PT session. Patient denied pain during session. Paused tube feed during mobility, RN made aware. Turned on R&B music, patient's selection, to provide increased stimulus  and promote patient arousal during session.   Therapeutic Activity: Bed Mobility: Patient performed supine to/from sit as above. Performed scooting up in bed with supervision and cues for hand placement on rails and pushing up with R UE with HOB elevated. Educated on using technique to reduce sliding down and improved bed positioning during the day. Transfers: Patient performed squat pivot x1 and stand pivot x1 with mod A while blocking L knee to prevent buckling, however knee flexed without signs of buckling throughout. Provided verbal cues for forward weight shift, hand placement, sequencing, and reaching back to sit for controlled descent.  Wheelchair Mobility:  Provided patient with TIS w/c with Phillip Barrett gel/foam hybrid cushion for improved sitting tolerance and positioning OBB. Patient tolerated sitting in the TIS w/c >15 min between activity. Discussed patient's leasure activities and activity level prior to admission, with increased time due to speech deficits.   Neuromuscular Re-ed: Patient performed the following LE and balance activities: -sit to/from stand from elevated bed without AD focused on forward weight shift and trunk, hip, and knee elongation L>R x4 -standing balance focused on maintaining midline 2x1-1.5 min -weight shifts in standing 2x30 sec with manual facilitation for shifting and cues for L knee extension with increased weight bearing -sitting balance with supervision and facilitation for lateral leans R/L focused on later trunk activation on return 2x4  Patient in bed at end of session with breaks locked, bed alarm set, and all needs within reach. Tube feed resumed at end of session.    Therapy Documentation Precautions:  Precautions Precautions: Fall Precaution Comments: NG tube Restrictions Weight Bearing Restrictions:  No Other Position/Activity Restrictions: HOB >30 degrees General: PT Amount of Missed Time (min): 15 Minutes PT Missed Treatment Reason: Patient  fatigue    Therapy/Group: Individual Therapy  Phillip Barrett PT, DPT  03/10/2020, 4:50 PM

## 2020-03-10 NOTE — Progress Notes (Signed)
Occupational Therapy Session Note  Patient Details  Name: Phillip Barrett MRN: [id number] Date of Birth: [birth year]  Today's Date: 03/10/2020 OT Individual Time: 9826-4158 OT Individual Time Calculation (min): 55 min    Short Term Goals: Week 1:  OT Short Term Goal 1 (Week 1): Pt will groom in standing wiht MIN A for improved activity tolerance OT Short Term Goal 2 (Week 1): pt will sit EOB/EOB during funcitonal activity for 5 min with S for dynamic sitting balnace OT Short Term Goal 3 (Week 1): Pt will locate 2/3 ADL items on L visual field for improved L attention wiht MIN VC OT Short Term Goal 4 (Week 1): Pt will transfer to toilet with MOD A of 1 caregiver  Skilled Therapeutic Interventions/Progress Updates:    1;1. Pt received in bed agreeable to OT. Pain 2 out of 10 with rest and repositioning provided PRN. Pt agreeable to shower. Ot utilizes stedy to transfer pt into shower with MIN A sit to stand in stedy and VC for weight shifting R to combat L lean. Pt urinates in shower seated on BSC. Pt completes bathing with CGA for sitting balance on BSC with pt using BUE to wash 8/11 body parts. A provided for buttocks and B feet. Pt able to clean peri area in seated with BSC cut out and stand with MOD A and grab bar for OT to wash buttocks. Pt dresses with MOD A for shirt with VC for hemi dressing, and MAX A for LB. Pt with tight hips impacting figure 4 positioning for access to feet. Pt returned to bed with MOD A closed chain transfer with bed rail and decreased weight shift L during pivot steps with RN in room, call light in reach and exit alarm on   Therapy Documentation Precautions:  Precautions Precautions: Fall Precaution Comments: NG tube Restrictions Weight Bearing Restrictions: No Other Position/Activity Restrictions: HOB >30 degrees General:   Vital Signs: Therapy Vitals Temp: 98.3 F (36.8 C) Pulse Rate: 93 Resp: 17 BP: 109/73 Patient Position (if appropriate):  Lying Oxygen Therapy SpO2: 96 % Pain:   ADL: ADL Eating: NPO Grooming: Moderate assistance Where Assessed-Grooming: Edge of bed (suction toothbrush) Upper Body Bathing: Minimal assistance Where Assessed-Upper Body Bathing: Edge of bed Lower Body Bathing: Maximal assistance Where Assessed-Lower Body Bathing: Edge of bed Upper Body Dressing: Maximal cueing Where Assessed-Upper Body Dressing: Edge of bed Lower Body Dressing: Maximal assistance Where Assessed-Lower Body Dressing: Edge of bed Toileting: Maximal assistance (stedy) Where Assessed-Toileting: Bedside Commode Toilet Transfer: Moderate verbal cueing (stedy) Toilet Transfer Equipment: Bedside commode Vision   Perception    Praxis   Exercises:   Other Treatments:     Therapy/Group: Individual Therapy  Tonny Branch 03/10/2020, 7:45 AM

## 2020-03-10 NOTE — Progress Notes (Signed)
This morning, patient refused for the NT to take his vitals stating that he was starrving & needed something to eat. At the time of his insulin administration, he tried to refuse insulin but explained to him in depth about his tube feeding & how he is receiving his nutrition through it. Patient does not understand & repeatedly states that he's hungry & he has not eaten or had a drink. He c/o a headache & when this nurse returned with his pain medication & klonopin for his agitation, he refused it stating that he had a headache because he was hungry & that he could not take it on a empty stomach. The medication was returned & klonopin given. He was pulled up multiple times during the night but slides back down in the bed. The importance of his positioning with the tube feeding running was explained with risks & benefits. No acute distress noted. Will give report to the oncoming nurse.

## 2020-03-11 ENCOUNTER — Inpatient Hospital Stay (HOSPITAL_COMMUNITY): Payer: Medicare Other | Admitting: Speech Pathology

## 2020-03-11 ENCOUNTER — Inpatient Hospital Stay (HOSPITAL_COMMUNITY): Payer: Medicare Other

## 2020-03-11 ENCOUNTER — Inpatient Hospital Stay (HOSPITAL_COMMUNITY): Payer: Medicare Other | Admitting: Physical Therapy

## 2020-03-11 LAB — HEPATIC FUNCTION PANEL
ALT: 72 U/L — ABNORMAL HIGH (ref 0–44)
AST: 38 U/L (ref 15–41)
Albumin: 3 g/dL — ABNORMAL LOW (ref 3.5–5.0)
Alkaline Phosphatase: 75 U/L (ref 38–126)
Bilirubin, Direct: <0.1 mg/dL (ref 0.0–0.2)
Total Bilirubin: 0.3 mg/dL (ref 0.3–1.2)
Total Protein: 7.9 g/dL (ref 6.5–8.1)

## 2020-03-11 LAB — GLUCOSE, CAPILLARY
Glucose-Capillary: 114 mg/dL — ABNORMAL HIGH (ref 70–99)
Glucose-Capillary: 136 mg/dL — ABNORMAL HIGH (ref 70–99)
Glucose-Capillary: 151 mg/dL — ABNORMAL HIGH (ref 70–99)
Glucose-Capillary: 192 mg/dL — ABNORMAL HIGH (ref 70–99)

## 2020-03-11 LAB — AMMONIA: Ammonia: 29 umol/L (ref 9–35)

## 2020-03-11 NOTE — IPOC Note (Signed)
Overall Plan of Care [facility]) Patient Details Name: Phillip Barrett MRN: [id number] DOB: [birth year]  Admitting Diagnosis: TBI (traumatic brain injury) [facility])  Hospital Problems: Principal Problem:   TBI (traumatic brain injury) ([place]) Active Problems:   Type 2 diabetes mellitus not at goal [facility])   [place] (subarachnoid hemorrhage) ([place])     Functional Problem List: Nursing Behavior, Bladder  PT Balance, Behavior, Safety, Sensory, Skin Integrity, Edema, Endurance, Motor, Nutrition, Pain, Perception  OT Balance, Cognition, Edema, Endurance, Motor, Nutrition, Pain, Perception, Safety, Sensory, Vision  SLP Cognition  TR         Basic ADLs: OT Grooming, Bathing, Dressing, Toileting, Eating     Advanced  ADLs: OT       Transfers: PT Bed Mobility, Bed to Chair, Car, Furniture, Floor  OT Toilet, Tub/Shower     Locomotion: PT Ambulation, Emergency planning/management officer, Stairs     Additional Impairments: OT Fuctional Use of Upper Extremity  SLP Swallowing, Communication, Social Cognition expression Attention  TR      Anticipated Outcomes Item Anticipated Outcome  Self Feeding S (pending improvement in swallowing strategies)  Swallowing  Supervision A   Basic self-care  S  Toileting  S   Bathroom Transfers S  Bowel/Bladder  To be continent x 2 with min assist  Transfers  Supervision  Locomotion  min A 100 ft  Communication  Supervision A  Cognition  Mod I orientation, Supervision A attention  Pain  pt will verbalize pain of less than 3  Safety/Judgment  Pt will remain fall free while in rehab with min assist   Therapy Plan: PT Intensity: Minimum of 1-2 x/day ,45 to 90 minutes PT Frequency: 5 out of 7 days PT Duration Estimated Length of Stay: 3-4 weeks OT Intensity: Minimum of 1-2 x/day, 45 to 90 minutes OT Frequency: 5 out of 7 days OT Duration/Estimated Length of Stay: 3-4 weeks SLP Intensity: Minumum of 1-2 x/day, 30 to 90 minutes SLP Frequency: 3 to 5 out of 7  days SLP Duration/Estimated Length of Stay: 3-4 weeks   Due to the current state of emergency, patients may not be receiving their 3-hours of Medicare-mandated therapy.   Team Interventions: Nursing Interventions Patient/Family Education  PT interventions Ambulation/gait training, Community reintegration, DME/adaptive equipment instruction, Neuromuscular re-education, Psychosocial support, Stair training, UE/LE Strength taining/ROM, Wheelchair propulsion/positioning, UE/LE Coordination activities, Therapeutic Activities, Skin care/wound management, Pain management, Functional electrical stimulation, Discharge planning, Training and development officer, Cognitive remediation/compensation, Disease management/prevention, Functional mobility training, Patient/family education, Splinting/orthotics, Therapeutic Exercise, Visual/perceptual remediation/compensation  OT Interventions Balance/vestibular training, Discharge planning, Pain management, Self Care/advanced ADL retraining, Therapeutic Activities, UE/LE Coordination activities, Visual/perceptual remediation/compensation, Therapeutic Exercise, Skin care/wound managment, Patient/family education, Functional mobility training, Cognitive remediation/compensation, Disease mangement/prevention, Academic librarian, Engineer, drilling, Neuromuscular re-education, Psychosocial support, Splinting/orthotics, Wheelchair propulsion/positioning, UE/LE Strength taining/ROM  SLP Interventions Cognitive remediation/compensation, Cueing hierarchy, Dysphagia/aspiration precaution training, Functional tasks, Internal/external aids  TR Interventions    SW/CM Interventions Psychosocial Support, Patient/Family Education, Discharge Planning   Barriers to Discharge MD  Medical stability  Nursing Incontinence, Inaccessible home environment, Decreased caregiver support, Home environment access/layout, Lack of/limited family support, Behavior    PT Inaccessible  home environment, Decreased caregiver support, Home environment access/layout, Lack of/limited family support, Behavior    OT Decreased caregiver support, Lack of/limited family support, Home environment access/layout, Medication compliance    SLP      SW       Team Discharge Planning: Destination: PT-Home (may need SNF placement due to limited family support) ,OT- Home ,  SLP-Skilled Nursing Facility (SNF) Projected Follow-up: PT-Home health PT, Skilled nursing facility, 24 hour supervision/assistance (may need SNF placement due to limited family support), OT-  Skilled nursing facility, Home health OT, SLP-Home Health SLP, Skilled Nursing facility, 24 hour supervision/assistance Projected Equipment Needs: PT-To be determined, OT- To be determined, SLP-None recommended by SLP Equipment Details: PT-Will determine patient DME need based on patient's progress, OT-  Patient/family involved in discharge planning: PT- Patient,  OT-Patient unable/family or caregiver not available, SLP-Patient  MD ELOS: 3-4 weeks Medical Rehab Prognosis:  Excellent Assessment: The patient has been admitted for CIR therapies with the diagnosis of TBI. The team will be addressing functional mobility, strength, stamina, balance, safety, adaptive techniques and equipment, self-care, bowel and bladder mgt, patient and caregiver education, NMR, swallowing, communication, behavioral mod. Goals have been set at supervision for self-care, supervision to min assist for mobility and supervision for cognition, speech and swallowing. .   Due to the current state of emergency, patients may not be receiving their 3 hours per day of Medicare-mandated therapy.    Meredith Staggers, MD, FAAPMR      See Team Conference Notes for weekly updates to the plan of care

## 2020-03-11 NOTE — Progress Notes (Signed)
Physical Therapy Session Note  Patient Details  Name: Phillip Barrett MRN: [id number] Date of Birth: 1976/02/09  Today's Date: 03/11/2020 PT Individual Time: 2111-7356 PT Individual Time Calculation (min): 72 min   Short Term Goals: Week 1:  PT Short Term Goal 1 (Week 1): Patient will perform basic transfers with mod A consistently. PT Short Term Goal 2 (Week 1): Patient will perform standing balance >2 min during functional activities with min A. PT Short Term Goal 3 (Week 1): Patient will ambulated >20 ft using LRAD with mod A. Week 2:     Skilled Therapeutic Interventions/Progress Updates:   Pt received supine in bed, reporting need for BM. Supine>sit transfer with min assist and trunk and cues for proper UE placement. Squat pivot transfner to [facility] with macx assist due to poor LE postioning and difficulty WB through the LLE. Stand pivot transfer to [facility] over toilet with mod assist and LUE over therapist shoulder.   Pt noted to have flatulence sitting on toilet but no bowel or bladder movements. Sit<>stand with min-mod assist using rail to perform pericare to ensure adequate hygiene. Stand pivot to return to [facility] with mod assist to advance the LLE and BUE support on rail.  Pt transported to rehab gym in [facility]. PT assessed VS in sitting 127/78. HR 109. Standing 107/78, HR 121. Gait training with RUE on IV pole and LUE on therapist shoulder. Max assist from PT 2 x 29f. Pt required to assist with weight shift and to advance the RLE, intermittent blocking to prevent buckling in turns to sit.   Nustep reciprocal movement training BUE/BLE with L thigh support to improve hip alignment. X 8 min, level 3, with cues for full ROM.   Pt returned to room and performed stand pivot transfer to bed with mod assist and UE support on PT nad IV pole. Sit>supine completed with min assist, and left supine in bed with call bell in reach and all needs met.        Therapy Documentation Precautions:   Precautions Precautions: Fall Precaution Comments: NG tube Restrictions Weight Bearing Restrictions: No Other Position/Activity Restrictions: HOB >30 degrees   Pain: denies   Therapy/Group: Individual Therapy  ALorie Phenix8/13/2021, 2:21 PM
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
## 2020-03-11 NOTE — Progress Notes (Signed)
Speech Language Pathology Daily Session Note  Patient Details  Name: Phillip Barrett MRN: [id number] Date of Birth: 11/28/1975  Today's Date: 03/11/2020 SLP Individual Time: 0825-0920 SLP Individual Time Calculation (min): 55 min  Short Term Goals: Week 1: SLP Short Term Goal 1 (Week 1): Pt will consume trials of thin liquids via TSP with minimal overt s/s aspiration to indicate readiness of repeat MBS. SLP Short Term Goal 2 (Week 1): Pt will consume trials of puree textures with oral clearance and minimial overt s/s aspiration to indictate readiness for repeat MBS. SLP Short Term Goal 3 (Week 1): Pt will participate in oropharyngeal exercises/RMT to improve swallow function prior to repeat MBS with mod A verbal cues. SLP Short Term Goal 4 (Week 1): Pt will utilizie speech intelligibility strategies at the phrase level to produce 60% intelligbility with max A verbal cues. SLP Short Term Goal 5 (Week 1): Pt will demonstrate orientation to place, time and situtation with max A verbal cues. SLP Short Term Goal 6 (Week 1): Pt will demonstrate sustained attention in functional tasks in 10 minute intervals with min A verbal cues for redirection.  Skilled Therapeutic Interventions: skilled treatment session focused on dysphagia and speech goals. Upon arrival, patient appeared more alert resulting in increased ability to manage his secretions and minimal coughing noted throughout session.  SLP provided thorough oral care and trials of ice chips with focus on effortful swallows. Patient with delayed coughing in 50% of trials with use of multiple swallows. Recommend ongoing trials with SLP. Patient  performed 25 repetitions of EMST on 7cm H2O with Mod verbal and visual cues for accuracy. Pharyngeal strengthening exercises (CTAR) were also initiated. Patient was ~50% intelligible at the phrase and sentence level with Max A verbal cues needed for use of speech intelligibility strategies. Patient left upright in  bed with alarm on and all needs within reach. Continue with current plan of care.      Pain No/Denies Pain   Therapy/Group: Individual Therapy  [name] 03/11/2020, 12:20 PM

## 2020-03-11 NOTE — Progress Notes (Signed)
Occupational Therapy Session Note  Patient Details  Name: Phillip Barrett MRN: [id number] Date of Birth: 26-Oct-1975  Today's Date: 03/11/2020 OT Individual Time: 1000-1100 OT Individual Time Calculation (min): 60 min    Short Term Goals: Week 1:  OT Short Term Goal 1 (Week 1): Pt will groom in standing wiht MIN A for improved activity tolerance OT Short Term Goal 2 (Week 1): pt will sit EOB/EOB during funcitonal activity for 5 min with S for dynamic sitting balnace OT Short Term Goal 3 (Week 1): Pt will locate 2/3 ADL items on L visual field for improved L attention wiht MIN VC OT Short Term Goal 4 (Week 1): Pt will transfer to toilet with MOD A of 1 caregiver  Skilled Therapeutic Interventions/Progress Updates:     1;1. Pt received in bed agreeable to OT and shower. Pt completes all transfers with stedy with supervision for sit to stand following commands well. Pt bathes seated with trunkal ataxia increased today as pt seated on shower chair with no sides to challenge sitting blaance. Pt requires A for buttocks in standing (MIN A to power up) and A to wash feet. Otherwise pt able to sequence and thoroughly bathe other body parts with min VC. Pt dons shirt with MIN A and pants with mod A to thread LLE into pants. Sit to stand CM with pt able to complete 75% up hips. Socks donned with A to position and maintain seated figure 4. Pt educated on OOB tolerance and left in TIS, belt alarm on, TIS tilted back, call light in reach and all needs met.  Therapy Documentation Precautions:  Precautions Precautions: Fall Precaution Comments: NG tube Restrictions Weight Bearing Restrictions: No Other Position/Activity Restrictions: HOB >30 degrees General:   Vital Signs: Therapy Vitals Temp: 98.4 F (36.9 C) Temp Source: Oral Pulse Rate: 98 BP: (!) 120/92 Oxygen Therapy SpO2: 98 % Pain:   ADL: ADL Eating: NPO Grooming: Moderate assistance Where Assessed-Grooming: Edge of bed (suction  toothbrush) Upper Body Bathing: Minimal assistance Where Assessed-Upper Body Bathing: Edge of bed Lower Body Bathing: Maximal assistance Where Assessed-Lower Body Bathing: Edge of bed Upper Body Dressing: Maximal cueing Where Assessed-Upper Body Dressing: Edge of bed Lower Body Dressing: Maximal assistance Where Assessed-Lower Body Dressing: Edge of bed Toileting: Maximal assistance (stedy) Where Assessed-Toileting: Bedside Commode Toilet Transfer: Moderate verbal cueing (stedy) Toilet Transfer Equipment: Bedside commode Vision   Perception    Praxis   Exercises:   Other Treatments:     Therapy/Group: Individual Therapy  Tonny Branch 03/11/2020, 11:01 AM

## 2020-03-11 NOTE — Progress Notes (Signed)
   Patient Details  Name: Phillip Barrett MRN: [id number] Date of Birth: June 29, 1976  Today's Date: 03/11/2020  Hospital Problems: Principal Problem:   TBI (traumatic brain injury) [facility]) Active Problems:   Type 2 diabetes mellitus not at goal [facility])   [place] (subarachnoid hemorrhage) ([place])  Past Medical History:  Past Medical History:  Diagnosis Date  . Diabetes mellitus without complication ([place])   . Hyperlipidemia   . Hypertension    Past Surgical History: History reviewed. No pertinent surgical history. Social History:  reports that he has been smoking cigarettes. He has been smoking about 1.00 pack per day. He uses smokeless tobacco. He reports current alcohol use of about 2.0 standard drinks of alcohol per week. He reports current drug use. Drugs: Cocaine and Marijuana.  Family / Support Systems Marital Status: Single Spouse/Significant Other: N/A Children: No children Other Supports: None reported Anticipated Caregiver: TBD Ability/Limitations of Caregiver: Father works, is elderly, and lives in [place]. States pt will need to be more physically independent if he were to come to [place]. Otherwise pt will require SNF. Caregiver Availability: 24/7 Family Dynamics: Pt lived alone in a rooming house. Pt has a rep payee  Social History Preferred language: English Religion:  Cultural Background: Pt has worked various blue collar job. Pt receives SSDI. Read: Yes Write: Yes Employment Status: Disabled Public relations account executive Issues: Denies Guardian/Conservator: N/A   Abuse/Neglect Abuse/Neglect Assessment Can Be Completed: Yes Physical Abuse: Denies Verbal Abuse: Denies Sexual Abuse: Denies Exploitation of patient/patient's resources: Denies Self-Neglect: Denies  Emotional Status Pt's affect, behavior and adjustment status: Pt pleasant at time of visit. Pt has slow and garbarled speech at times, often makign it difficult to understand him. Recent Psychosocial Issues:  Denies Psychiatric History: Denies Substance Abuse History: Pt admits to marijuana use 2xs per week; and cocaine use but unsure on frequency. Pt admits to smoking 2 packs of cigarettes per day. Pt admits to 15pk beer every 3 days.  Patient / Family Perceptions, Expectations & Goals Pt/Family understanding of illness & functional limitations: Pt father has a general understanding of care needs Premorbid pt/family roles/activities: Independent Anticipated changes in roles/activities/participation: Assistance with ADLs/IADLs  Community Resources Express Scripts: None Premorbid Home Care/DME Agencies: None Transportation available at discharge: TBD  Discharge Planning Living Arrangements: Other (Comment) Support Systems: Parent Type of Residence: Private residence Insurance Resources: Chartered certified accountant Resources: SSD Financial Screen Referred: No Living Expenses: Rent Money Management: Other (Comment) (Social security-payee) Does the patient have any problems obtaining your medications?: No Care Coordinator Barriers to Discharge: Decreased caregiver support, Lack of/limited family support Care Coordinator Anticipated Follow Up Needs: SNF  Clinical Impression SW met with pt at bedside to introduce self, explain role, and discuss discharge process. Pt has no HCPOA- but willing to allow his father to make medical decisions. Pt is not a English as a second language teacher. Pt desires to d/c to home with his father in [place]. DME: RW.   Rana Snare 03/11/2020, 4:17 PM

## 2020-03-12 ENCOUNTER — Inpatient Hospital Stay (HOSPITAL_COMMUNITY): Payer: Medicare Other | Admitting: Occupational Therapy

## 2020-03-12 ENCOUNTER — Inpatient Hospital Stay (HOSPITAL_COMMUNITY): Payer: Medicare Other | Admitting: Speech Pathology

## 2020-03-12 ENCOUNTER — Inpatient Hospital Stay (HOSPITAL_COMMUNITY): Payer: Medicare Other

## 2020-03-12 LAB — GLUCOSE, CAPILLARY
Glucose-Capillary: 214 mg/dL — ABNORMAL HIGH (ref 70–99)
Glucose-Capillary: 171 mg/dL — ABNORMAL HIGH (ref 70–99)
Glucose-Capillary: 166 mg/dL — ABNORMAL HIGH (ref 70–99)
Glucose-Capillary: 135 mg/dL — ABNORMAL HIGH (ref 70–99)

## 2020-03-12 MED ORDER — INSULIN ASPART 100 UNIT/ML ~~LOC~~ SOLN
4.0000 [IU] | Freq: Four times a day (QID) | SUBCUTANEOUS | Status: DC
  Administered 2020-03-12 – 2020-03-16 (×8): 4 [IU] via SUBCUTANEOUS

## 2020-03-12 NOTE — Progress Notes (Signed)
Physical Therapy Session Note  Patient Details  Name: Phillip Barrett MRN: [id number] Date of Birth: May 23, 1976  Today's Date: 03/12/2020 PT Individual Time: 1355-1450 PT Individual Time Calculation (min): 55 min   Short Term Goals: Week 1:  PT Short Term Goal 1 (Week 1): Patient will perform basic transfers with mod A consistently. PT Short Term Goal 2 (Week 1): Patient will perform standing balance >2 min during functional activities with min A. PT Short Term Goal 3 (Week 1): Patient will ambulated >20 ft using LRAD with mod A.  Skilled Therapeutic Interventions/Progress Updates:  Pt deeply asleep but eventually awakened to touch and name.  He denied pain.  Pt moved supine> sit on R side of bed with min assist.  In sitting, he listed L but corrected briefly with cues.  Squat pivot transfer bed> w.c> R, afte PT placed pt's r hand on R armrest of w/c, mod assist.  neuromuscular re-education via forced use, multimodal cues for alternating reciprocal movements bil LEs, using Kinetron from w/c, sitting upright, at resistance 40 cm/sec, x 25 cycles x 2.  With urging pt counted aloud softly.  Gait training using grocery cart as AD, x 10' with mod assist x 1 and w/c follow by RT.  Pt's L knee was unstable during L single limb stance about 50% of steps.  VC to take "big step" with L foot improved his clearance of this foot.  Pt fatigued and sat suddenly in w/c behind him.  Stand pivot w/c> bed with use of bed rail in L hand, mod assist  Sit> supine with min assist for LLE.  At end of session, pt resting in bed with needs at hand and bed alarm set.     Therapy Documentation Precautions:  Precautions Precautions: Fall Precaution Comments: NG tube Restrictions Weight Bearing Restrictions: No Other Position/Activity Restrictions: HOB >30 degrees      Therapy/Group: Individual Therapy  Russie Gulledge 03/12/2020, 5:27 PM
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
## 2020-03-12 NOTE — Progress Notes (Signed)
Occupational Therapy Session Note  Patient Details  Name: Phillip Barrett MRN: [id number] Date of Birth: [birth year]  Today's Date: 03/12/2020 OT Individual Time: 1000-1055  OT Individual Treatment Time Calculation: 55 minutes  Skilled Therapeutic Interventions/Progress Updates:    Pt greeted in bed with c/o pain from being hungry and unable to eat. Pt asking therapist throughout session if he can eat something, reeducated pt on his NPO status and that he cannot eat at this time. Supine<sit completed with Mod A and Max A for squat pivot<TIS. Pt then engaged in bathing/dressing tasks sit<stand at the sink. Note that pt was very impulsive with stand attempts. Worked extensively on proper hand placement during sit<stand transitions due to pts tendency to hold onto the faucet or levers. Min-Mod A for power ups, Mod A for dynamic standing during perihygiene with poor standing endurance, pt initiated sitting frequently, therefore OT assisted him with pericare in the back. Max A for LB dressing, note that pt had a tough time incorporating his Lt hand functionally. Min A to use the Lt UE to reach for soap dispenser when washing hands and also when turning on the hot water. OT performed oral care using the suction toothbrush and then pt was reclined in TIS for comfort and safety, safety belt fastened and call bell in hand. Tx focus placed on NMR, ADL retraining, functional cognition, and standing balance.      Therapy Documentation Precautions:  Precautions Precautions: Fall Precaution Comments: NG tube Restrictions Weight Bearing Restrictions: No Other Position/Activity Restrictions: HOB >30 degrees ADL: ADL Eating: NPO Grooming: Moderate assistance Where Assessed-Grooming: Edge of bed (suction toothbrush) Upper Body Bathing: Minimal assistance Where Assessed-Upper Body Bathing: Edge of bed Lower Body Bathing: Maximal assistance Where Assessed-Lower Body Bathing: Edge of bed Upper Body Dressing:  Maximal cueing Where Assessed-Upper Body Dressing: Edge of bed Lower Body Dressing: Maximal assistance Where Assessed-Lower Body Dressing: Edge of bed Toileting: Maximal assistance (stedy) Where Assessed-Toileting: Bedside Commode Toilet Transfer: Moderate verbal cueing (stedy) Toilet Transfer Equipment: Bedside commode     Therapy/Group: Individual Therapy  Damichael Hofman A Vernica Wachtel 03/12/2020, 12:25 PM

## 2020-03-12 NOTE — Progress Notes (Signed)
Speech Language Pathology Daily Session Note  Patient Details  Name: Phillip Barrett MRN: [id number] Date of Birth: 04/06/1976  Today's Date: 03/12/2020 SLP Individual Time: 6060-0459 SLP Individual Time Calculation (min): 55 min  Short Term Goals: Week 1: SLP Short Term Goal 1 (Week 1): Pt will consume trials of thin liquids via TSP with minimal overt s/s aspiration to indicate readiness of repeat MBS. SLP Short Term Goal 2 (Week 1): Pt will consume trials of puree textures with oral clearance and minimial overt s/s aspiration to indictate readiness for repeat MBS. SLP Short Term Goal 3 (Week 1): Pt will participate in oropharyngeal exercises/RMT to improve swallow function prior to repeat MBS with mod A verbal cues. SLP Short Term Goal 4 (Week 1): Pt will utilizie speech intelligibility strategies at the phrase level to produce 60% intelligbility with max A verbal cues. SLP Short Term Goal 5 (Week 1): Pt will demonstrate orientation to place, time and situtation with max A verbal cues. SLP Short Term Goal 6 (Week 1): Pt will demonstrate sustained attention in functional tasks in 10 minute intervals with min A verbal cues for redirection.  Skilled Therapeutic Interventions: Skilled treatment session focused on swallowing and speech goals. Upon arrival, patient appeared lethargic and demonstrated language of confusion. RN aware and reported it may be due to medications he received during the night. SLP provided thorough oral care and administered trials of ice chips with focus on effortful swallows. Patient with delayed coughing in 50% of trials with use of multiple swallows. Recommend ongoing trials with SLP. Patient  performed 25 repetitions of EMST on 7cm H2O with Max verbal and visual cues for accuracy. Pharyngeal strengthening exercises (CTAR) were also performed with extra time. Patient was ~25-50% intelligible at the phrase and sentence level with Max A verbal cues needed for use of speech  intelligibility strategies. Patient left upright in bed with alarm on and all needs within reach. Continue with current plan of care.          Pain No/Denies Pain   Therapy/Group: Individual Therapy  Phillip Barrett 03/12/2020, 12:12 PM

## 2020-03-13 LAB — GLUCOSE, CAPILLARY
Glucose-Capillary: 179 mg/dL — ABNORMAL HIGH (ref 70–99)
Glucose-Capillary: 101 mg/dL — ABNORMAL HIGH (ref 70–99)
Glucose-Capillary: 169 mg/dL — ABNORMAL HIGH (ref 70–99)
Glucose-Capillary: 148 mg/dL — ABNORMAL HIGH (ref 70–99)
Glucose-Capillary: 96 mg/dL (ref 70–99)

## 2020-03-13 NOTE — Progress Notes (Signed)
Patient noted to be more agitated this afternoon. Patient yelling out "Im hungry" and "Your starving me". Education repeatedly reinforced regarding tube feeding and patient not be able to swallow safely. PRN Klonopin given at 1549. Patient able to rest calmly for about 2 hours then repeated same complaints. Glucerna running at 85cc/hr. Oral care done multiple times this shift. Patient has been continent and incontinent this shift.

## 2020-03-13 NOTE — Plan of Care (Signed)
  Problem: Consults Goal: RH BRAIN INJURY PATIENT EDUCATION Description: Description: See Patient Education module for eduction specifics Outcome: Progressing Goal: Nutrition Consult-if indicated Outcome: Progressing   Problem: RH BOWEL ELIMINATION Goal: RH STG MANAGE BOWEL WITH ASSISTANCE Description: STG Manage Bowel with min Assistance. Outcome: Progressing   Problem: RH BLADDER ELIMINATION Goal: RH STG MANAGE BLADDER WITH ASSISTANCE Description: STG Manage Bladder With min Assistance Outcome: Progressing Goal: RH STG MANAGE BLADDER WITH EQUIPMENT WITH ASSISTANCE Description: STG Manage Bladder With Equipment With min Assistance Outcome: Progressing   Problem: RH SKIN INTEGRITY Goal: RH STG SKIN FREE OF INFECTION/BREAKDOWN Description: Skin will remain free of infection/breakdown with min assist Outcome: Progressing Goal: RH STG MAINTAIN SKIN INTEGRITY WITH ASSISTANCE Description: STG Maintain Skin Integrity With min Assistance. Outcome: Progressing   Problem: RH PAIN MANAGEMENT Goal: RH STG PAIN MANAGED AT OR BELOW PT'S PAIN GOAL Description: Pt will be able to verbalize pain less than 3 out of 10 with min assist  Outcome: Progressing   Problem: RH SAFETY Goal: RH STG ADHERE TO SAFETY PRECAUTIONS W/ASSISTANCE/DEVICE Description: STG Adhere to Safety Precautions With min Assistance/Device. Outcome: Not Progressing   Problem: RH COGNITION-NURSING Goal: RH STG USES MEMORY AIDS/STRATEGIES W/ASSIST TO PROBLEM SOLVE Description: STG Uses Memory Aids/Strategies With min Assistance to Problem Solve. Outcome: Not Progressing   Problem: RH KNOWLEDGE DEFICIT BRAIN INJURY Goal: RH STG INCREASE KNOWLEDGE OF SELF CARE AFTER BRAIN INJURY Description: Pt and family will be able to identify 3 ways to improve pt safety awareness when returning home with min assist.  Outcome: Not Progressing
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
## 2020-03-14 ENCOUNTER — Inpatient Hospital Stay (HOSPITAL_COMMUNITY): Payer: Medicare Other

## 2020-03-14 ENCOUNTER — Inpatient Hospital Stay (HOSPITAL_COMMUNITY): Payer: Medicare Other | Admitting: Speech Pathology

## 2020-03-14 LAB — BASIC METABOLIC PANEL
Anion gap: 10 (ref 5–15)
BUN: 17 mg/dL (ref 6–20)
CO2: 27 mmol/L (ref 22–32)
Calcium: 9.4 mg/dL (ref 8.9–10.3)
Chloride: 101 mmol/L (ref 98–111)
Creatinine, Ser: 0.69 mg/dL (ref 0.61–1.24)
GFR calc Af Amer: >60 mL/min (ref >60)
GFR calc non Af Amer: >60 mL/min (ref >60)
Glucose, Bld: 120 mg/dL — ABNORMAL HIGH (ref 70–99)
Potassium: 4.1 mmol/L (ref 3.5–5.1)
Sodium: 138 mmol/L (ref 135–145)

## 2020-03-14 LAB — GLUCOSE, CAPILLARY
Glucose-Capillary: 104 mg/dL — ABNORMAL HIGH (ref 70–99)
Glucose-Capillary: 140 mg/dL — ABNORMAL HIGH (ref 70–99)
Glucose-Capillary: 163 mg/dL — ABNORMAL HIGH (ref 70–99)
Glucose-Capillary: 167 mg/dL — ABNORMAL HIGH (ref 70–99)
Glucose-Capillary: 168 mg/dL — ABNORMAL HIGH (ref 70–99)
Glucose-Capillary: 180 mg/dL — ABNORMAL HIGH (ref 70–99)

## 2020-03-14 MED ORDER — METHYLPHENIDATE HCL 5 MG PO TABS
10.0000 mg | ORAL_TABLET | Freq: Two times a day (BID) | ORAL | Status: DC
  Administered 2020-03-14 – 2020-03-18 (×8): 10 mg via NASOGASTRIC
  Filled 2020-03-14 (×8): qty 2

## 2020-03-14 NOTE — Progress Notes (Signed)
Items witness by NT Cassandria Anger and placed in bedside table in patient room ( 4 W-09)will inform oncoming nurse of items so family can be notified.

## 2020-03-14 NOTE — Progress Notes (Signed)
Items found in patient belonging bag, patient was inform of locating these items, he states he has been looking for his damn stuff

## 2020-03-14 NOTE — Progress Notes (Signed)
Physical Therapy Session Note  Patient Details  Name: Phillip Barrett MRN: [id number] Date of Birth: [birth year]  Today's Date: 03/14/2020 PT Individual Time: 1005-1048 PT Individual Time Calculation (min): 43 min   Short Term Goals: Week 1:  PT Short Term Goal 1 (Week 1): Patient will perform basic transfers with mod A consistently. PT Short Term Goal 2 (Week 1): Patient will perform standing balance >2 min during functional activities with min A. PT Short Term Goal 3 (Week 1): Patient will ambulated >20 ft using LRAD with mod A.  Skilled Therapeutic Interventions/Progress Updates:     Patient sitting EOB with RN in the room upon PT arrival. Patient alert and agreeable to PT session. Patient denied pain during session. Patient expressed frustration at beginning of session, stated that he had not had a good morning. Patient discussed concerns about not eating or drinking and frustration with this. PT provided education on dysphasia and aspiration pneumonia as well as injuries contrinbuting to NPO status. Also, educated on nutritional value and intake via tube feeds. Patient continues to report feeling hungry with tube feeds, but also in agreement that he feels hungry because he is not physically eating rather than true hunger sensation. RN made aware of education provided and discussion with patient. Patient then agreeable to focus on transfer and gait training for the remainder of the session.  Therapeutic Activity: Bed Mobility: Patient performed supine to/from sit with supervision with HOB >30 degrees without use of bed rails and scooting up in bed pushing with B LEs with facilitation from PT for L foot. Provided verbal cues for sequencing/technique. Transfers: Patient performed sit to/from stand x1 without an AD and x3 in the // bars with min A and PT blocking L knee. Provided verbal cues for forward weight shift, L foot placement, and L knee and hip extension to come to standing. He attempted  stand pivot x1, continues to have difficulty with motor planning to safety step to pivot. Patient returned to sitting then performed squat pivot bed<>w/c with min A. Provided cues for removal of arm rest, hand placement and foot placement, and scooting back for improved positioning in the chair/bed after.  Gait Training:  Patient ambulated 10 feet forwards x2 and backwards x1 using // bars with min A and blocking L knee. Ambulated with step-to progressing to step-through gait pattern leading with L, decreased step length and stance time on L with decreased L knee stability in stance with buckling x1. Provided verbal cues for sequencing, L quad and gluteal activation in stance, and weight shifting.  Wheelchair Mobility:  Patient was transported in the [place] w/c with total A throughout session for energy conservation and time management.  Patient in bed at end of session with breaks locked, bed alarm set, and all needs within reach.    Therapy Documentation Precautions:  Precautions Precautions: Fall Precaution Comments: NG tube Restrictions Weight Bearing Restrictions: No Other Position/Activity Restrictions: HOB >30 degrees    Therapy/Group: Individual Therapy  [name] L Tamra Koos PT, DPT  03/14/2020, 4:13 PM

## 2020-03-14 NOTE — Progress Notes (Signed)
Speech Language Pathology Daily Session Note  Patient Details  Name: Phillip Barrett MRN: [id number] Date of Birth: [birth year]  Today's Date: 03/14/2020 SLP Individual Time: 0800-0825 SLP Individual Time Calculation (min): 25 min  Short Term Goals: Week 1: SLP Short Term Goal 1 (Week 1): Pt will consume trials of thin liquids via TSP with minimal overt s/s aspiration to indicate readiness of repeat MBS. SLP Short Term Goal 2 (Week 1): Pt will consume trials of puree textures with oral clearance and minimial overt s/s aspiration to indictate readiness for repeat MBS. SLP Short Term Goal 3 (Week 1): Pt will participate in oropharyngeal exercises/RMT to improve swallow function prior to repeat MBS with mod A verbal cues. SLP Short Term Goal 4 (Week 1): Pt will utilizie speech intelligibility strategies at the phrase level to produce 60% intelligbility with max A verbal cues. SLP Short Term Goal 5 (Week 1): Pt will demonstrate orientation to place, time and situtation with max A verbal cues. SLP Short Term Goal 6 (Week 1): Pt will demonstrate sustained attention in functional tasks in 10 minute intervals with min A verbal cues for redirection.  Skilled Therapeutic Interventions: Skilled treatment session focused on swallowing and speech goals. Upon arrival, patient was sitting upright on EOB and agreeable to participate in treatment session. SLP provided Min verbal cues for patient to perform thorough oral care via the suction toothbrush. SLP administered trials of ice chips with focus on effortful swallows. Patient demonstrated with delayed coughing in 100% of trials with use of multiple swallows. Cues were also needed for an effective throat clear with re-swallow. Recommend ongoing trials with SLP. Patient performed 25 repetitions of EMST on 7cm H2O with Min verbal cues for accuracy. Pharyngeal strengthening exercises (CTAR) were also performed with extra time. Patient was ~50% intelligible at the  phrase and sentence level with Max A verbal cues needed for use of speech intelligibility strategies. Patient left upright in bed with alarm on and all needs within reach. Continue with current plan of care.      Pain Pain Assessment Pain Scale: Faces Faces Pain Scale: No hurt  Therapy/Group: Individual Therapy  Phillip Barrett 03/14/2020, 10:20 AM
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
## 2020-03-14 NOTE — Progress Notes (Signed)
Occupational Therapy Session Note  Patient Details  Name: Phillip Barrett MRN: [id number] Date of Birth: Sep 30, 1975  Today's Date: 03/14/2020 OT Individual Time: 1115-1150 OT Individual Time Calculation (min): 35 min  10 min missed 2/2 fatigue.    Session 2: OT Individual Time: 8341-9622 OT Individual Time Calculation (min): 70 min    Short Term Goals: Week 1:  OT Short Term Goal 1 (Week 1): Pt will groom in standing wiht MIN A for improved activity tolerance OT Short Term Goal 2 (Week 1): pt will sit EOB/EOB during funcitonal activity for 5 min with S for dynamic sitting balnace OT Short Term Goal 3 (Week 1): Pt will locate 2/3 ADL items on L visual field for improved L attention wiht MIN VC OT Short Term Goal 4 (Week 1): Pt will transfer to toilet with MOD A of 1 caregiver  Skilled Therapeutic Interventions/Progress Updates:    Pt received supine, lethargic but agreeable to OT session. Pt completed bed mobility to EOB with min A. Pt was set up for oral hygiene using suction toothbrush. Pt required min A overall for thoroughness, as well as increased time for processing/motor planning. OT assisted pt in using blunt syringe to rinse mouth out with suction to avoid drinking of water. Pt perseverative on eating, edu provided re NPO status. Pt completed sit > stand with the stedy with CGA. He declined toileting but was agreeable to change brief and complete peri hygiene. Pt was able to stand with minimal use of the front plate of the stedy with min A with 1 UE supporting him while he washed anterior/posterior peri areas. New brief was donned and pt required mod A overall for clothing management in standing. Pt now with eyes half open and requesting to take a nap. Pt returned to EOB and transitioned to supine with CGA. Pt was left supine with all needs met, eyes closed. 10 min missed 2/2 fatigue.   Session 2: Pt received supine sleeping. Pt difficult to arouse initially but with verbal and tactile  cueing he awoke and was agreeable to take shower. Pt completed sit > stand from the EOB with CGA in the stedy. Pt required min cueing for LE placement. Pt was transferred into the bathroom and into the walk in shower on BSC. Pt doffed shirt and pants with mod A overall. Pt bathed UB with (S), min cueing. Mod A for LB bathing sit <> stand. Pt had one LOB in standing to the L with max A required to safely return to sitting. Assist required to clean feet and to thoroughly wash his bottom. Pt donned shirt with min A to pull down posteriorly. Mod A to don pants, to thread distally over feet, with pt pulling up legs himself. Pt then required assistance to pull up pants in standing, as well as mod A to stand from [facility]. Pt stood in the stedy and was brought in front of the sink. Pt completed oral care thoroughly, brushing teeth and doing a good job of not swallowing water. Pt opted to stay up in the TIS w/c and his safety belt was fastened. Pt took a rest break, closing his eyes for several minutes. He was then able to participate in therapeutic activity- holding a 1 kg medicine ball and reaching overhead/forward to build BUE coordination and strength. Pt was left sitting up in the TIS with all needs met, chair alarm belt fastene.   Therapy Documentation Precautions:  Precautions Precautions: Fall Precaution Comments: NG tube Restrictions Weight  Bearing Restrictions: No Other Position/Activity Restrictions: HOB >30 degrees   Therapy/Group: Individual Therapy  [name] 03/14/2020, 6:36 AM

## 2020-03-15 ENCOUNTER — Inpatient Hospital Stay (HOSPITAL_COMMUNITY): Payer: Medicare Other | Admitting: Speech Pathology

## 2020-03-15 ENCOUNTER — Inpatient Hospital Stay (HOSPITAL_COMMUNITY): Payer: Medicare Other

## 2020-03-15 LAB — GLUCOSE, CAPILLARY
Glucose-Capillary: 133 mg/dL — ABNORMAL HIGH (ref 70–99)
Glucose-Capillary: 135 mg/dL — ABNORMAL HIGH (ref 70–99)
Glucose-Capillary: 137 mg/dL — ABNORMAL HIGH (ref 70–99)
Glucose-Capillary: 166 mg/dL — ABNORMAL HIGH (ref 70–99)
Glucose-Capillary: 99 mg/dL (ref 70–99)

## 2020-03-15 MED ORDER — GLUCERNA 1.5 CAL PO LIQD
1000.0000 mL | ORAL | Status: DC
  Administered 2020-03-15 – 2020-03-17 (×4): 1000 mL
  Filled 2020-03-15 (×6): qty 1000
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
## 2020-03-15 NOTE — Patient Care Conference (Signed)
Inpatient RehabilitationTeam Conference and Plan of Care Update Date: 03/15/2020   Time: 10:10 AM    Patient Name: Phillip Barrett      Medical Record Number: [id number]  Date of Birth: 1976/02/24 Sex: Male         Room/Bed: 4W09C/4W09C-01 Payor Info: Payor: MEDICARE / Plan: MEDICARE PART A AND B / Product Type: *No Product type* /    Admit Date/Time:  03/08/2020  2:36 PM  Primary Diagnosis:  TBI (traumatic brain injury) [facility])  Hospital Problems: Principal Problem:   TBI (traumatic brain injury) ([place]) Active Problems:   Type 2 diabetes mellitus not at goal [facility])   [place] (subarachnoid hemorrhage) ([place])    Expected Discharge Date: Expected Discharge Date:  (2-3 weeks)  Team Members Present: Physician leading conference: Dr. Alger Simons Care Coodinator Present: Loralee Pacas, LCSWA;Jethro Radke Creig Hines, RN, BSN, [place] Nurse Present: Benjie Karvonen, RN PT Present: Apolinar Junes, PT OT Present: Laverle Hobby, OT SLP Present: [name], SLP PPS Coordinator present : Ileana Ladd, Burna Mortimer, SLP     Current Status/Progress Goal Weekly Team Focus  Bowel/Bladder   Continent/incontinent at times; last BM 8/16  To be continent x2  Assess every shift and as needed   Swallow/Nutrition/ Hydration   NPO with NG tube  Supervision  EMST, pharyngeal strengthening exercises, trials of ice chips   ADL's   Mod A LB ADls, min A UB ADLs, stedy transfers, L buckling/LOB  Supervision overall  Cognitive retraining, arousal, ADLS, transfers, motor planning, initiation   Mobility   Supervision bed mobility, min-mod A transfers, min A gait 10 ft in // bars  Supervision bed mobility and transfers, min A gait 100 ft, mod A stairs  Arousal, activity tolerance, frustration tolerance, functional mobility, gait and stair training, balance, L UE/LE NMR, patient/caregiver education   Communication   Mod-Max A  Supervision  increase use of speech intelligibility strategies   Safety/Cognition/  Behavioral Observations  Mod A  Supervision  orientation, attention   Pain   No pain reported during shift  Remain pain free or to have minimal pain <3  Assess every shift and as needed   Skin   Abrasion, skin otherwise intact  Prevent skin breakdown  Assess as needed and every shift     Discharge Planning:  D/c location pending care needs. Pt father stated pt will need short rehab in SNF until pt is stronger as he continues to work and not physically able to care for him.   Team Discussion: Continent of B/B. Periods of agitation. Sleeping well. Mod assist lower body, min assist upper body, with LLE tends to buckle. Poor safety awareness, LLE mod assist if it buckles. Continue gait training. SLP working on RMT, swallowing exercises. Repeat swallowing test Wed/Thur. Pt is decreased burden of care as father is not physically able to take care of him. Patient on target to meet rehab goals: yes  *See Care Plan and progress notes for long and short-term goals.   Revisions to Treatment Plan:  None  Teaching Needs: Continue working towards discharge goals.  Current Barriers to Discharge: Decreased caregiver support, Lack of/limited family support and Safety awareness.  Possible Resolutions to Barriers: Continue to work towards discharge goals, improve agitation, improve sleep-wake cycle, advance diet, continue to educate pt on safety awareness.     Medical Summary Current Status: TBI generally on right side [facility], SDH). signficant dysphagia, dysarthria. NPO  Barriers to Discharge: Behavior;Medical stability   Possible Resolutions to [employer]: improve agitation,  regular sleep-wake cycle, advance to diet, increasing TF in short term   Continued Need for Acute Rehabilitation Level of Care: The patient requires daily medical management by a physician with specialized training in physical medicine and rehabilitation for the following reasons: Direction of a multidisciplinary  physical rehabilitation program to maximize functional independence : Yes Medical management of patient stability for increased activity during participation in an intensive rehabilitation regime.: Yes Analysis of laboratory values and/or radiology reports with any subsequent need for medication adjustment and/or medical intervention. : Yes   I attest that I was present, lead the team conference, and concur with the assessment and plan of the team.   Cristi Loron 03/15/2020, 1:51 PM

## 2020-03-15 NOTE — Progress Notes (Signed)
Speech Language Pathology Daily Session Note  Patient Details  Name: Phillip Barrett MRN: [id number] Date of Birth: 1975/08/30  Today's Date: 03/15/2020 SLP Individual Time: 5498-2641 SLP Individual Time Calculation (min): 40 min  Short Term Goals: Week 1: SLP Short Term Goal 1 (Week 1): Pt will consume trials of thin liquids via TSP with minimal overt s/s aspiration to indicate readiness of repeat MBS. SLP Short Term Goal 2 (Week 1): Pt will consume trials of puree textures with oral clearance and minimial overt s/s aspiration to indictate readiness for repeat MBS. SLP Short Term Goal 3 (Week 1): Pt will participate in oropharyngeal exercises/RMT to improve swallow function prior to repeat MBS with mod A verbal cues. SLP Short Term Goal 4 (Week 1): Pt will utilizie speech intelligibility strategies at the phrase level to produce 60% intelligbility with max A verbal cues. SLP Short Term Goal 5 (Week 1): Pt will demonstrate orientation to place, time and situtation with max A verbal cues. SLP Short Term Goal 6 (Week 1): Pt will demonstrate sustained attention in functional tasks in 10 minute intervals with min A verbal cues for redirection.  Skilled Therapeutic Interventions: Skilled treatment session focused on dysphagia goals. SLP provided supervision verbal cues for patient to perform thorough oral care via the suction toothbrush. SLP administered trials of ice chipswith focus on effortful swallows with minimal overt s/s of aspiration noted. Suspect patient will be ready for repeat MBS on 8/19.  Patient performed 25 repetitions of EMST on 10 cm H2O (increasd from 7 cm H2O) with Minverbal cues for accuracy. Pharyngeal strengthening exercises (CTAR, masako, etc.) were also performed with extra time and MIn verbal cues for accuracy. Patient with increased speech intelligibility today at the phrase and sentence level requiring Min A verbal cues to achieve ~75% intelligibility.  Patient left upright in  bed with alarm on and all needs within reach. Continue with current plan of care.     Pain No/Denies Pain  Therapy/Group: Individual Therapy  [name] 03/15/2020, 3:05 PM

## 2020-03-15 NOTE — Progress Notes (Signed)
Occupational Therapy Session Note  Patient Details  Name: Phillip Barrett MRN: [id number] Date of Birth: [birth year]  Today's Date: 03/15/2020 OT Individual Time: 1093-2355 OT Individual Time Calculation (min): 57 min    Short Term Goals: Week 1:  OT Short Term Goal 1 (Week 1): Pt will groom in standing wiht MIN A for improved activity tolerance OT Short Term Goal 2 (Week 1): pt will sit EOB/EOB during funcitonal activity for 5 min with S for dynamic sitting balnace OT Short Term Goal 3 (Week 1): Pt will locate 2/3 ADL items on L visual field for improved L attention wiht MIN VC OT Short Term Goal 4 (Week 1): Pt will transfer to toilet with MOD A of 1 caregiver  Skilled Therapeutic Interventions/Progress Updates:    Upon entering room RN giving pt medication via NG tube, pt very upset re NPO status and yelling/crying. Copious education provided, but pt still very upset. Attempted distraction and redirection, pt slowly calming down. Therapeutic use of self/body language to calm pt down. SLP also entered room to reinforce previously provided education. Pt agreeable to get out of bed after several minutes of therapeutic listening and support while supine. Min A to transfer to EOB. Pt completed squat pivot transfer to the TIS w/c from elevated EOB with min A. Pt was brought to the sink where he completed oral care. Several minutes of coughing after oral care. Pt washed his face with set up assist. Min A to stand from TIS with UE support on the sink. Min A overall to remain standing for peri hygiene, however blocking required at the L knee, with very frequent buckling and 2 instances of max A to sit required. Pt completed all hygiene in standing and required mod A to manage clothing and don new brief. Pt was left sitting up in the TIS w/c with seatbelt fastened and alarm belt fastened as well. Pt's SLP provided swallowing exercise supplies were placed directly in front of pt and he was encouraged to continue  doing these.   Therapy Documentation Precautions:  Precautions Precautions: Fall Precaution Comments: NG tube Restrictions Weight Bearing Restrictions: No Other Position/Activity Restrictions: HOB >30 degrees   Therapy/Group: Individual Therapy  [name] 03/15/2020, 6:48 AM

## 2020-03-15 NOTE — Progress Notes (Signed)
Physical Therapy Session Note  Patient Details  Name: Phillip Barrett MRN: [id number] Date of Birth: 1976/02/04  Today's Date: 03/15/2020 PT Individual Time: 4097-3532 and 1300-1345 PT Individual Time Calculation (min): 47 min and 45 min   Short Term Goals: Week 1:  PT Short Term Goal 1 (Week 1): Patient will perform basic transfers with mod A consistently. PT Short Term Goal 2 (Week 1): Patient will perform standing balance >2 min during functional activities with min A. PT Short Term Goal 3 (Week 1): Patient will ambulated >20 ft using LRAD with mod A.  Skilled Therapeutic Interventions/Progress Updates:     Session 1: Patient in bed upon PT arrival. Patient alert and agreeable to PT session. Patient denied pain during session. Patient continues to request food and drink, reoriented patient to situation and provided education on dysphasia and tube feeds. Patient was transported outside, per patient request, during w/c mobility for improved emotional well being. Discussed outdoor activities the patient enjoys. Patient perseverated on going outside to smoke during discussion. Provided education on smoking cessation and consequences of smoking during recovery from TBI. Patient participated in discussion appropriately, however, needed redirection due to internal distraction x2.   Therapeutic Activity: Bed Mobility: Patient performed supine to/from sit with supervision using hospital bed features.  Transfers: Patient performed squat pivot transfer bed>w/c with min A-CGA with cues for hand placement and blocking L knee. He performed sit to/from stand x2 using a RW with min A for forward weight shift and blocking L knee to promote L quad activation and prevent buckling. Provided verbal cues for hand placement and forward weight shift. He performed a stand pivot transfer back to the bed using the RW with min A and cues for sequencing and L quad activation.   Gait Training:  Patient ambulated 12 feet  and 18 feet using RW with min-mod A for weight shift, steadying support, and blocking L knee. Ambulated with step-to gait pattern leading with L with decreased L knee control, L weight shift, and step length. Provided verbal cues for sequencing, L limb advancement, and L quad activation in stance.  Wheelchair Mobility:  Patient propelled wheelchair 55 feet with min A for steering due to tendency for patient to veer L due to L UE weakness. Provided verbal cues and HOH assist x2 for equal propulsion with B UEs, and turning technique. Patient utilized breaks appropriately without cues x1, otherwise required total a for break and leg rest management throughout session.   Patient in bed at end of session with breaks locked, bed alarm set, and all needs within reach.   Session 2: Patient in bed asleep upon PT arrival. Patient easily aroused and agreeable to PT session. Patient denied pain during session. Patient required increased time with mobility initially due to fatigue/lethargy, improved with increased mobility.   Therapeutic Activity: Bed Mobility: Patient performed supine to/from sit as above. Transfers: Patient performed stand pivot bed>w/c with min-mod A, increased assist due to patient spontaneously sitting prior to completing turn to sit in the w/c despite cues for sequencing/safety. He performed sit to/from stand x2 with min A, as above.   Gait Training:  Patient ambulated 30 feet and 50 feet using RW with min A, as above. Focused on increased L step length, L knee control in stance, erect posture, and improved weight shift throughout with multimodal cues. Provided positive reinforcement during and after trials.   Wheelchair Mobility:  Patient was transported in the w/c with total A throughout session for energy  conservation and time management.  Patient required rest breaks between all mobility due to fatigue. Discussed energy conservation techniques and performed B UE shoulder flexion  AROM during rest breaks. Patient able to achieve equal shoulder flexion bilaterally, with increased time/effort on L.   Patient in bed at end of session with breaks locked, bed alarm set, and all needs within reach.    Therapy Documentation Precautions:  Precautions Precautions: Fall Precaution Comments: NG tube Restrictions Weight Bearing Restrictions: No Other Position/Activity Restrictions: HOB >30 degrees    Therapy/Group: Individual Therapy  Jordis Repetto L Johnelle Tafolla PT, DPT  03/15/2020, 4:06 PM
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
## 2020-03-16 ENCOUNTER — Inpatient Hospital Stay (HOSPITAL_COMMUNITY): Payer: Medicare Other

## 2020-03-16 ENCOUNTER — Inpatient Hospital Stay (HOSPITAL_COMMUNITY): Payer: Medicare Other | Admitting: Speech Pathology

## 2020-03-16 LAB — GLUCOSE, CAPILLARY
Glucose-Capillary: 111 mg/dL — ABNORMAL HIGH (ref 70–99)
Glucose-Capillary: 120 mg/dL — ABNORMAL HIGH (ref 70–99)
Glucose-Capillary: 124 mg/dL — ABNORMAL HIGH (ref 70–99)
Glucose-Capillary: 150 mg/dL — ABNORMAL HIGH (ref 70–99)

## 2020-03-16 NOTE — Progress Notes (Signed)
Occupational Therapy Session Note  Patient Details  Name: Phillip Barrett MRN: [id number] Date of Birth: [birth year]  Today's Date: 03/16/2020 OT Individual Time: 1520-1600 OT Individual Time Calculation (min): 40 min    Short Term Goals: Week 1:  OT Short Term Goal 1 (Week 1): Pt will groom in standing wiht MIN A for improved activity tolerance OT Short Term Goal 1 - Progress (Week 1): Met OT Short Term Goal 2 (Week 1): pt will sit EOB/EOB during funcitonal activity for 5 min with S for dynamic sitting balnace OT Short Term Goal 2 - Progress (Week 1): Met OT Short Term Goal 3 (Week 1): Pt will locate 2/3 ADL items on L visual field for improved L attention wiht MIN VC OT Short Term Goal 3 - Progress (Week 1): Met OT Short Term Goal 4 (Week 1): Pt will transfer to toilet with MOD A of 1 caregiver OT Short Term Goal 4 - Progress (Week 1): Met  Skilled Therapeutic Interventions/Progress Updates:    1:1. Pt received in bed agreeable to OT. Pt completes supine>sitting EOB with S and bed rails. Ptstand pivot transfer with MIN A for steadying d/t L knee instability and MAX VC for RW maangment/hand placement. Pt completes the following assessements seated  Box and blocks RUE: 27 LUE 15  Dynamometer RUE 40# LUE 29#  Dynavision standing 1 min trials with RW for steadying RUE: 23 lights and 2.6 second reaction time (no significant difference in between reaction time for locating stimuli between quadrants.  LUE 10 lights and 6.0 second reaction time (mild ataxia noted when reaching).   Exited session with pt seated in bed, exit alarm on and call light in reach  Therapy Documentation Precautions:  Precautions Precautions: Fall Precaution Comments: NG tube Restrictions Weight Bearing Restrictions: No Other Position/Activity Restrictions: HOB >30 degrees General: General PT Missed Treatment Reason: Patient fatigue Vital Signs: Therapy Vitals Temp: 98.6 F (37 C) Pulse Rate:  99 Resp: 19 BP: (!) 119/92 Patient Position (if appropriate): Sitting Oxygen Therapy SpO2: 100 % O2 Device: Room Air Pain:   ADL: ADL Eating: NPO Grooming: Moderate assistance Where Assessed-Grooming: Edge of bed (suction toothbrush) Upper Body Bathing: Minimal assistance Where Assessed-Upper Body Bathing: Edge of bed Lower Body Bathing: Maximal assistance Where Assessed-Lower Body Bathing: Edge of bed Upper Body Dressing: Maximal cueing Where Assessed-Upper Body Dressing: Edge of bed Lower Body Dressing: Maximal assistance Where Assessed-Lower Body Dressing: Edge of bed Toileting: Maximal assistance (stedy) Where Assessed-Toileting: Bedside Commode Toilet Transfer: Moderate verbal cueing (stedy) Toilet Transfer Equipment: Bedside commode Vision   Perception    Praxis   Exercises:   Other Treatments:     Therapy/Group: Individual Therapy  Tonny Branch 03/16/2020, 3:44 PM

## 2020-03-16 NOTE — Progress Notes (Signed)
Physical Therapy Weekly Progress Note  Patient Details  Name: Phillip Barrett MRN: [id number] Date of Birth: Nov 10, 1975  Beginning of progress report period: March 09, 2020 End of progress report period: March 16, 2020  Today's Date: 03/16/2020 PT Individual Time: 1020-1105 PT Individual Time Calculation (min): 45 min   Patient has met 3 of 3 short term goals.  Patient demonstrated excellent progress with PT this week. He currently requires supervision for bed mobility with hospital bed features, min A for basic transfers, mod A with fatigue or impulsivity, min A for gait 50 feet with a RW, and min A for w/c mobility. Patient does present with poor frustration tolerance regarding NPO status during each session. He is easily redirected following education on dysphasia and consequences of aspiration. Patient is more aroused than at admission improving participation and with improved volume of speech. Continues to have significant dysarthria limiting efficient communication during sessions.  Patient continues to demonstrate the following deficits muscle weakness, decreased cardiorespiratoy endurance, abnormal tone and decreased motor planning, decreased attention, decreased awareness, decreased problem solving, decreased safety awareness and decreased memory and decreased sitting balance, decreased standing balance, decreased postural control, hemiplegia and decreased balance strategies and therefore will continue to benefit from skilled PT intervention to increase functional independence with mobility.  Patient progressing toward long term goals.  Plan of care revisions: Upgraded gait and stair goals due to patient's progress with mobility. See Care Plan for details..  PT Short Term Goals Week 1:  PT Short Term Goal 1 (Week 1): Patient will perform basic transfers with mod A consistently. PT Short Term Goal 1 - Progress (Week 1): Met PT Short Term Goal 2 (Week 1): Patient will perform standing  balance >2 min during functional activities with min A. PT Short Term Goal 2 - Progress (Week 1): Met PT Short Term Goal 3 (Week 1): Patient will ambulated >20 ft using LRAD with mod A. PT Short Term Goal 3 - Progress (Week 1): Met Week 2:  PT Short Term Goal 1 (Week 2): Patient will perform basic transfers with CGA using LRAD. PT Short Term Goal 2 (Week 2): Patient will ambulate >150 ft with min A using LRAD. PT Short Term Goal 3 (Week 2): Patient will perform 4 steps using B rails with min A.  Skilled Therapeutic Interventions/Progress Updates:     Patient in bed asleep upon PT arrival. Patient aroused to verbal stimuli and agreeable to PT session. Patient denied pain during session, however, reported increased fatigue this morning. Reports that he slept well, but states he is having difficulty waking-up. Patient required increased time with all mobility due to fatigue this session. Focused on blocked practice stand pivot transfer training during session.   Therapeutic Activity: Bed Mobility: Patient performed supine to/from sit with supervision-mod I with hospital bed functions.  Transfers: Patient performed stand pivot transfers bed<>w/c and w/c<>mat table x3 with min A progressing to CGA while blocking L knee to prevent buckling with bucking x1 throughout. Provided verbal cues for hand placement, completing full turn with RW around him before reaching back to sit for controlled descent. Reduced cues with each transfer with improved technique.   Wheelchair Mobility:  Patient propelled wheelchair ~75 feet, limited by fatigue, with supervision using B UEs with improved steering following cues for reduced use of R UE and increased propulsion with L UE to compensate for L UE strength deficits. Patient required min cues for use of breaks throughout session.  Patient reported increased fatigue throughout  session, requested to return to bed to rest after w/c mobility. Patient missed 15 min of skilled  PT due to fatigue, RN made aware. Will attempt to make-up missed time as able.    Tube feed disconnected by NT for mobility, charge nurse aware, and RN made aware when patient was returned to the room to re-connect the patient to his tube feed.   Patient in bed with Telesitter in the room at end of session with breaks locked, bed alarm set, and all needs within reach.    Therapy Documentation Precautions:  Precautions Precautions: Fall Precaution Comments: NG tube Restrictions Weight Bearing Restrictions: No Other Position/Activity Restrictions: HOB >30 degrees General: PT Amount of Missed Time (min): 15 Minutes PT Missed Treatment Reason: Patient fatigue  Therapy/Group: Individual Therapy  Puneet Masoner L Trygg Mantz PT, DPT  03/16/2020, 3:40 PM
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
## 2020-03-16 NOTE — Plan of Care (Signed)
  Problem: RH Ambulation Goal: LTG Patient will ambulate in controlled environment (PT) Description: LTG: Patient will ambulate in a controlled environment, # of feet with assistance (PT). Flowsheets (Taken 03/16/2020 0622) LTG: Pt will ambulate in controlled environ  assist needed:: Supervision/Verbal cueing LTG: Ambulation distance in controlled environment: (upgraded goal due to patient's progress) >150 ft using LRAD Note: upgraded goal due to patient's progress Goal: LTG Patient will ambulate in home environment (PT) Description: LTG: Patient will ambulate in home environment, # of feet with assistance (PT). Flowsheets Taken 03/16/2020 0622 LTG: Pt will ambulate in home environ  assist needed:: (upgraded goal due to patient's progress) Supervision/Verbal cueing Taken 03/09/2020 1708 LTG: Ambulation distance in home environment: 50 ft using LRAD Note: upgraded goal due to patient's progress   Problem: RH Stairs Goal: LTG Patient will ambulate up and down stairs w/assist (PT) Description: LTG: Patient will ambulate up and down # of stairs with assistance (PT) Flowsheets Taken 03/16/2020 0622 LTG: Pt will ambulate up/down stairs assist needed:: (upgraded goal due to patient's progress) Contact Guard/Touching assist Taken 03/09/2020 1708 LTG: Pt will  ambulate up and down number of stairs: 4 steps using LRAD Note: upgraded goal due to patient's progress
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
## 2020-03-16 NOTE — Progress Notes (Signed)
Speech Language Pathology Daily Session Note  Patient Details  Name: Phillip Barrett MRN: [id number] Date of Birth: March 14, 1976  Today's Date: 03/16/2020 SLP Individual Time: 1300-1355 SLP Individual Time Calculation (min): 55 min  Short Term Goals: Week 1: SLP Short Term Goal 1 (Week 1): Pt will consume trials of thin liquids via TSP with minimal overt s/s aspiration to indicate readiness of repeat MBS. SLP Short Term Goal 2 (Week 1): Pt will consume trials of puree textures with oral clearance and minimial overt s/s aspiration to indictate readiness for repeat MBS. SLP Short Term Goal 3 (Week 1): Pt will participate in oropharyngeal exercises/RMT to improve swallow function prior to repeat MBS with mod A verbal cues. SLP Short Term Goal 4 (Week 1): Pt will utilizie speech intelligibility strategies at the phrase level to produce 60% intelligbility with max A verbal cues. SLP Short Term Goal 5 (Week 1): Pt will demonstrate orientation to place, time and situtation with max A verbal cues. SLP Short Term Goal 6 (Week 1): Pt will demonstrate sustained attention in functional tasks in 10 minute intervals with min A verbal cues for redirection.  Skilled Therapeutic Interventions: Skilled treatment session focused on dysphagia and cognitive goals. SLP providedsupervision verbal cues for patient to performthorough oral care via the suction toothbrush. SLPadministered trials of ice chipswith focus on effortful swallows with minimal overt s/s of aspiration noted. Patient is scheduled for a repeat MBS tomorrow.  Patient performed 25 repetitions of EMST on 14 cm H2O (increasd from 10 cm H2O) with supervisionverbal cues for accuracy. Pharyngeal strengthening exercises (CTAR, masako, etc.) were also performed with extra time and MIn verbal cues for accuracy. Patient with increased speech intelligibility today at the phrase and sentence level requiring Min A verbal cues to achieve ~75% intelligibility.   Patient also participated in a basic money management task with Mod verbal cues needed for problem solving. Patient left upright in bed with alarm on and all needs within reach. Continue with current plan of care.     Pain No/Denies Pain   Therapy/Group: Individual Therapy  Sharda Keddy 03/16/2020, 3:03 PM

## 2020-03-16 NOTE — Progress Notes (Signed)
Occupational Therapy Weekly Progress Note  Patient Details  Name: Phillip Barrett MRN: [id number] Date of Birth: Jul 26, 1976  Beginning of progress report period: February 07, 2020 End of progress report period: February 14, 2020  Today's Date: 03/16/2020 OT Individual Time: 8315-1761 OT Individual Time Calculation (min): 45 min    Patient has met 4 of 4 short term goals.  Pt is making good progress this reporting period improving from needing stedy lift to transfer OOB to min A transfers d/t improved motor planning. Pt incorporates LUE into all ADLs with no VC, however continues to require increased time d/t decreased coordination/strength/attention to L extremities. Pt continues to demo decreased control of L knee which buckles intermittently. Pt would benefit from continued OT to decrease burden of care and increase independence towards supervision levels.  Patient continues to demonstrate the following deficits: muscle weakness, decreased cardiorespiratoy endurance, impaired timing and sequencing, unbalanced muscle activation and decreased coordination, decreased visual perceptual skills, decreased attention to left and decreased motor planning, decreased initiation, decreased attention, decreased awareness, decreased problem solving, decreased safety awareness, decreased memory and delayed processing and decreased sitting balance, decreased standing balance, decreased postural control, hemiplegia and decreased balance strategies and therefore will continue to benefit from skilled OT intervention to enhance overall performance with BADL and iADL.  Patient progressing toward long term goals..  Continue plan of care.  OT Short Term Goals Week 1:  OT Short Term Goal 1 (Week 1): Pt will groom in standing wiht MIN A for improved activity tolerance OT Short Term Goal 1 - Progress (Week 1): Met OT Short Term Goal 2 (Week 1): pt will sit EOB/EOB during funcitonal activity for 5 min with S for dynamic sitting  balnace OT Short Term Goal 2 - Progress (Week 1): Met OT Short Term Goal 3 (Week 1): Pt will locate 2/3 ADL items on L visual field for improved L attention wiht MIN VC OT Short Term Goal 3 - Progress (Week 1): Met OT Short Term Goal 4 (Week 1): Pt will transfer to toilet with MOD A of 1 caregiver OT Short Term Goal 4 - Progress (Week 1): Met Week 2:  OT Short Term Goal 1 (Week 2): Pt will bathe with CGA only for standing balance washing 11/11 body parts without physical A OT Short Term Goal 2 (Week 2): Pt will thread BLE into pants without VC for adaptive strategies to demo improved problem solving OT Short Term Goal 3 (Week 2): Pt will transfer to toielt wiht MIN A and LRAD OT Short Term Goal 4 (Week 2): Pt will don B socks with CGA  Skilled Therapeutic Interventions/Progress Updates:    1;1. Pt received in bed reporting rib discomfort stating, "its because I havent put anything on my stomach in a few days." Edu re tube feeding provided however limited understanding evident. Pt completes stand pivot transfers EOB<>w/c<>BSC in shower with MIN A overall and L knee instability noted with increased trunk sqay using grab bar/bed rail for closed chain transfers. Pt bathes 11/11 body parts with MIN A for standing blance during peri bathing and min VC for pacing shower/terminating bathing UB. Pt completes UB dressing with set up and LB dressing with VC for crossing into figure 4 to thread LLE. Pt requires total A to don socks f/t time. Exited session with pt seated in bed, exit alarm on and call light in reach  Therapy Documentation Precautions:  Precautions Precautions: Fall Precaution Comments: NG tube Restrictions Weight Bearing Restrictions: No Other  Position/Activity Restrictions: HOB >30 degrees General:   Vital Signs: Therapy Vitals Temp: 98.4 F (36.9 C) Temp Source: Oral Pulse Rate: 87 Resp: 16 BP: 119/79 Patient Position (if appropriate): Lying Oxygen Therapy SpO2: 100  % Pain: Pain Assessment Pain Scale: 0-10 Pain Score: 0-No pain ADL: ADL Eating: NPO Grooming: Moderate assistance Where Assessed-Grooming: Edge of bed (suction toothbrush) Upper Body Bathing: Minimal assistance Where Assessed-Upper Body Bathing: Edge of bed Lower Body Bathing: Maximal assistance Where Assessed-Lower Body Bathing: Edge of bed Upper Body Dressing: Maximal cueing Where Assessed-Upper Body Dressing: Edge of bed Lower Body Dressing: Maximal assistance Where Assessed-Lower Body Dressing: Edge of bed Toileting: Maximal assistance (stedy) Where Assessed-Toileting: Bedside Commode Toilet Transfer: Moderate verbal cueing (stedy) Toilet Transfer Equipment: Bedside commode Vision   Perception    Praxis   Exercises:   Other Treatments:     Therapy/Group: Individual Therapy  Tonny Branch 03/16/2020, 6:41 AM

## 2020-03-17 ENCOUNTER — Inpatient Hospital Stay (HOSPITAL_COMMUNITY): Payer: Medicare Other

## 2020-03-17 ENCOUNTER — Encounter (HOSPITAL_COMMUNITY): Payer: Medicare Other | Admitting: Speech Pathology

## 2020-03-17 ENCOUNTER — Inpatient Hospital Stay (HOSPITAL_COMMUNITY): Payer: Medicare Other | Admitting: Physical Therapy

## 2020-03-17 LAB — COMPREHENSIVE METABOLIC PANEL
ALT: 61 U/L — ABNORMAL HIGH (ref 0–44)
AST: 35 U/L (ref 15–41)
Albumin: 3.2 g/dL — ABNORMAL LOW (ref 3.5–5.0)
Alkaline Phosphatase: 74 U/L (ref 38–126)
Anion gap: 9 (ref 5–15)
BUN: 16 mg/dL (ref 6–20)
CO2: 27 mmol/L (ref 22–32)
Calcium: 9.4 mg/dL (ref 8.9–10.3)
Chloride: 100 mmol/L (ref 98–111)
Creatinine, Ser: 0.74 mg/dL (ref 0.61–1.24)
GFR calc Af Amer: >60 mL/min (ref >60)
GFR calc non Af Amer: >60 mL/min (ref >60)
Glucose, Bld: 157 mg/dL — ABNORMAL HIGH (ref 70–99)
Potassium: 4.1 mmol/L (ref 3.5–5.1)
Sodium: 136 mmol/L (ref 135–145)
Total Bilirubin: 0.2 mg/dL — ABNORMAL LOW (ref 0.3–1.2)
Total Protein: 7.5 g/dL (ref 6.5–8.1)

## 2020-03-17 LAB — CBC
HCT: 44.7 % (ref 39.0–52.0)
Hemoglobin: 14.2 g/dL (ref 13.0–17.0)
MCH: 27.8 pg (ref 26.0–34.0)
MCHC: 31.8 g/dL (ref 30.0–36.0)
MCV: 87.6 fL (ref 80.0–100.0)
Platelets: 377 10*3/uL (ref 150–400)
RBC: 5.1 MIL/uL (ref 4.22–5.81)
RDW: 13.3 % (ref 11.5–15.5)
WBC: 4.4 10*3/uL (ref 4.0–10.5)
nRBC: 0 % (ref 0.0–0.2)

## 2020-03-17 LAB — GLUCOSE, CAPILLARY
Glucose-Capillary: 103 mg/dL — ABNORMAL HIGH (ref 70–99)
Glucose-Capillary: 108 mg/dL — ABNORMAL HIGH (ref 70–99)
Glucose-Capillary: 153 mg/dL — ABNORMAL HIGH (ref 70–99)
Glucose-Capillary: 159 mg/dL — ABNORMAL HIGH (ref 70–99)
Glucose-Capillary: 192 mg/dL — ABNORMAL HIGH (ref 70–99)

## 2020-03-17 LAB — AMMONIA: Ammonia: 28 umol/L (ref 9–35)

## 2020-03-17 MED ORDER — DM-GUAIFENESIN ER 30-600 MG PO TB12
1.0000 | ORAL_TABLET | Freq: Two times a day (BID) | ORAL | Status: DC
  Administered 2020-03-17 – 2020-03-25 (×17): 1 via ORAL
  Filled 2020-03-17 (×17): qty 1

## 2020-03-17 MED ORDER — INSULIN ASPART 100 UNIT/ML ~~LOC~~ SOLN
0.0000 [IU] | Freq: Three times a day (TID) | SUBCUTANEOUS | Status: DC
  Administered 2020-03-17: 3 [IU] via SUBCUTANEOUS
  Administered 2020-03-18: 8 [IU] via SUBCUTANEOUS
  Administered 2020-03-18 – 2020-03-19 (×2): 3 [IU] via SUBCUTANEOUS
  Administered 2020-03-19: 5 [IU] via SUBCUTANEOUS
  Administered 2020-03-19 – 2020-03-20 (×2): 3 [IU] via SUBCUTANEOUS
  Administered 2020-03-20 (×2): 2 [IU] via SUBCUTANEOUS
  Administered 2020-03-21 (×2): 3 [IU] via SUBCUTANEOUS
  Administered 2020-03-21: 2 [IU] via SUBCUTANEOUS
  Administered 2020-03-22 (×2): 3 [IU] via SUBCUTANEOUS
  Administered 2020-03-23: 2 [IU] via SUBCUTANEOUS
  Administered 2020-03-23: 3 [IU] via SUBCUTANEOUS
  Administered 2020-03-23 – 2020-03-24 (×2): 2 [IU] via SUBCUTANEOUS
  Administered 2020-03-24 (×2): 5 [IU] via SUBCUTANEOUS
  Administered 2020-03-25: 3 [IU] via SUBCUTANEOUS
  Administered 2020-03-25: 2 [IU] via SUBCUTANEOUS

## 2020-03-17 MED ORDER — INSULIN ASPART 100 UNIT/ML ~~LOC~~ SOLN
4.0000 [IU] | Freq: Three times a day (TID) | SUBCUTANEOUS | Status: DC
  Administered 2020-03-17 – 2020-03-24 (×18): 4 [IU] via SUBCUTANEOUS

## 2020-03-17 NOTE — Plan of Care (Signed)
  Problem: RH Problem Solving Goal: LTG Patient will demonstrate problem solving for (SLP) Description: LTG:  Patient will demonstrate problem solving for basic/complex daily situations with cues  (SLP) Flowsheets (Taken 03/17/2020 1250) LTG: Patient will demonstrate problem solving for (SLP): Basic daily situations LTG Patient will demonstrate problem solving for: Minimal Assistance - Patient > 75% Note: Goal added 8/19    Problem: RH Memory Goal: LTG Patient will use memory compensatory aids to (SLP) Description: LTG:  Patient will use memory compensatory aids to recall biographical/new, daily complex information with cues (SLP) Flowsheets (Taken 03/17/2020 1250) LTG: Patient will use memory compensatory aids to (SLP): Minimal Assistance - Patient > 75% Note: Goal added 8/19   Problem: RH Awareness Goal: LTG: Patient will demonstrate awareness during functional activites type of (SLP) Description: LTG: Patient will demonstrate awareness during functional activites type of (SLP) Flowsheets (Taken 03/17/2020 1250) Patient will demonstrate during cognitive/linguistic activities awareness type of: Emergent LTG: Patient will demonstrate awareness during cognitive/linguistic activities with assistance of (SLP): Moderate Assistance - Patient 50 - 74% Note: Goal added 8/19

## 2020-03-17 NOTE — Progress Notes (Signed)
Speech Language Pathology Weekly Progress Note  Patient Details  Name: Phillip Barrett MRN: [id number] Date of Birth: 12/05/1975  Beginning of progress report period: March 08, 2020 End of progress report period: March 17, 2020  Short Term Goals: Week 1: SLP Short Term Goal 1 (Week 1): Pt will consume trials of thin liquids via TSP with minimal overt s/s aspiration to indicate readiness of repeat MBS. SLP Short Term Goal 1 - Progress (Week 1): Not met SLP Short Term Goal 2 (Week 1): Pt will consume trials of puree textures with oral clearance and minimial overt s/s aspiration to indictate readiness for repeat MBS. SLP Short Term Goal 2 - Progress (Week 1): Not met SLP Short Term Goal 3 (Week 1): Pt will participate in oropharyngeal exercises/RMT to improve swallow function prior to repeat MBS with mod A verbal cues. SLP Short Term Goal 3 - Progress (Week 1): Met SLP Short Term Goal 4 (Week 1): Pt will utilizie speech intelligibility strategies at the phrase level to produce 60% intelligbility with max A verbal cues. SLP Short Term Goal 4 - Progress (Week 1): Met SLP Short Term Goal 5 (Week 1): Pt will demonstrate orientation to place, time and situtation with max A verbal cues. SLP Short Term Goal 5 - Progress (Week 1): Met SLP Short Term Goal 6 (Week 1): Pt will demonstrate sustained attention in functional tasks in 10 minute intervals with min A verbal cues for redirection. SLP Short Term Goal 6 - Progress (Week 1): Met    New Short Term Goals: Week 2: SLP Short Term Goal 1 (Week 2): Patient will consume current diet with minimal overt s/s of aspiration and Min verbal cues for use of swallowing compensatory strategies. SLP Short Term Goal 2 (Week 2): Patient will utilize speech intelligibility strategies at the sentence level with Mod A verbal cues to achieve ~75% intelligibility. SLP Short Term Goal 3 (Week 2): Patient will utilize external aids to recall new, daily information with Mod  A verbal and visual cues. SLP Short Term Goal 4 (Week 2): Patient will demonstrate sustained attention to functional tasks for 30 minutes with Min verbal cues for redirection. SLP Short Term Goal 5 (Week 2): Patient will demonstrate functional problem solving for basic and familiar tasks with Mod verbal and visual cues.  Weekly Progress Updates: Patient has made functional gains and has met 4 of 6 STGs this reporting period. Patient had a repeat MBS today and a diet of Dys. 1 textures and nectar-thick liquids have been initiated with full supervision to maximize use of swallowing compensatory strategies. Patient demonstrates improved speech intelligibility at the phrase level and requires overall Mod A verbal cues for use of speech intelligibility strategies. Mod-Max A verbal cues are also needed for sustained attention and functional problem solving for basic and familiar tasks. Patient and family (via telephone) ongoing. Patient would benefit from continued skilled SLP intervention to maximize his swallowing, speech and cognitive functioning prior to discharge.      Intensity: Minumum of 1-2 x/day, 30 to 90 minutes Frequency: 3 to 5 out of 7 days Duration/Length of Stay: 2-3 weeks Treatment/Interventions: Cognitive remediation/compensation;Cueing hierarchy;Dysphagia/aspiration precaution training;Functional tasks;Internal/external aids;Speech/Language facilitation;Environmental controls;Therapeutic Activities;Patient/family education;Therapeutic Exercise    ,  03/17/2020, 6:17 AM         

## 2020-03-17 NOTE — Progress Notes (Signed)
Physical Therapy Session Note  Patient Details  Name: Phillip Barrett MRN: [id number] Date of Birth: [birth year]  Today's Date: 03/17/2020 PT Individual Time: 1696-7893 PT Individual Time Calculation (min): 42 min   Short Term Goals: Week 2:  PT Short Term Goal 1 (Week 2): Patient will perform basic transfers with CGA using LRAD. PT Short Term Goal 2 (Week 2): Patient will ambulate >150 ft with min A using LRAD. PT Short Term Goal 3 (Week 2): Patient will perform 4 steps using B rails with min A.  Skilled Therapeutic Interventions/Progress Updates:   Pt received supine in bed and agreeable to PT. Supine>sit transfer with min assist at trunk to push with LUE into full erect sitting. Modified stand pivot transfer to [facility] with min assist from PT, L knee noted to remain in flexed position. WC mobility performed through hall x 178f with supervision assist and cues for continued attention to task.   PT instructed pt in gait training with RW x 375f and 1797f 2 with min assist overall and tactile cues to improve terminal knee extension on the LLE. Pt reports feeling like his left leg "isn't working", but no buckling noted as well as improved advancement and step length and width compared to previous sessions. Prolonged rest break between bouts requested by pt.   Pt declined use of Nustep or kinetron, requesting to return to bed. Mild perseveration on removal of NG tube, when PT informed pt that it may be a few more days, pt with mildly frustrated and had decreased participation through the remainder of the treatment.   Pt returned to room and performed stand pivot transfer to bed with min  Assist and RW. Sit>supine completed with supervision assist, and left supine in bed with call bell in reach and all needs met.            Therapy Documentation Precautions:  Precautions Precautions: Fall Precaution Comments: NG tube Restrictions Weight Bearing Restrictions: No Other Position/Activity  Restrictions: HOB >30 degrees    Pain: denies   Therapy/Group: Individual Therapy  AusLorie Phenix19/2021, 2:44 PM

## 2020-03-17 NOTE — Progress Notes (Signed)
Modified Barium Swallow Progress Note  Patient Details  Name: Phillip Barrett MRN: [id number] Date of Birth: [birth year]  Today's Date: 03/17/2020  Modified Barium Swallow completed.  Full report located under Chart Review in the Imaging Section.  Brief recommendations include the following:  Clinical Impression  Patient demonstrates a moderate oropharyngeal dysphagia characterized by lingual weakness, delayed swallow initiation and decreased epiglottic inversion resulting in vallecular residue with all consistencies which patient was able to clear the majority of with subsequent swallows. Delayed and Intermittent, trace penetration of residuals was noted after the swallow. Suspect due to trials mixing with drainage/secretions. Patient sensed all penetration and was able to clear it with a strong throat clear. Patient with sensed aspiration of thin liquids. Recommend patient initiate a diet of Dys. 1 textures with nectar-thick liquids via cup with full supervision and cues for an intermittent throat clear. Patient verbalized understanding of information but will need reinforcement.   Swallow Evaluation Recommendations       SLP Diet Recommendations: Dysphagia 1 (Puree) solids;Nectar thick liquid   Liquid Administration via: Cup   Medication Administration: Crushed with puree   Supervision: Patient able to self feed;Full supervision/cueing for compensatory strategies   Compensations: Minimize environmental distractions;Slow rate;Small sips/bites;Clear throat intermittently;Multiple dry swallows after each bite/sip   Postural Changes: Seated upright at 90 degrees   Oral Care Recommendations: Oral care BID   Other Recommendations: Prohibited food (jello, ice cream, thin soups);Have oral suction available  [name], MA, CCC-SLP [phone]   Cici Rodriges 03/17/2020,9:41 AM

## 2020-03-17 NOTE — Progress Notes (Signed)
Occupational Therapy Session Note  Patient Details  Name: Phillip Barrett MRN: [id number] Date of Birth: [birth year]  Today's Date: 03/17/2020 OT Individual Time: 1438-8875 OT Individual Time Calculation (min): 70 min    Short Term Goals: Week 1:  OT Short Term Goal 1 (Week 1): Pt will groom in standing wiht MIN A for improved activity tolerance OT Short Term Goal 1 - Progress (Week 1): Met OT Short Term Goal 2 (Week 1): pt will sit EOB/EOB during funcitonal activity for 5 min with S for dynamic sitting balnace OT Short Term Goal 2 - Progress (Week 1): Met OT Short Term Goal 3 (Week 1): Pt will locate 2/3 ADL items on L visual field for improved L attention wiht MIN VC OT Short Term Goal 3 - Progress (Week 1): Met OT Short Term Goal 4 (Week 1): Pt will transfer to toilet with MOD A of 1 caregiver OT Short Term Goal 4 - Progress (Week 1): Met  Skilled Therapeutic Interventions/Progress Updates:    1:1. Pt received in bed agreeable to OT. Pt requesting to shower. Pt completes functional mobility into/out of bathroom with RW with MIN A for steadying, VC for RW management over bathroom threshold and VC for L attention to L knee demoing instability with WB but no full buckleing. Pt bathes at sit to stand level with cuing only to maintain 1UE support while washing buttocks/peri area d/t L knee instability. Pt able ot sequence and terminate shower apropriately this date. Pt completes dressing at EOB with S for sitting balance, however d/t trunkal ataxia pt occasionally reaching out to bed rail on R. Pt dons shirt with set up and underwear/pants with MIN A sit to stand. Pt able ot don B socks with feet levated on stool. Pt completes PVC pipe tree activity for visual perceptual training, L FMC, and coordination with min VC for pacing and increased time required for processing. Exited session with pt sated in bed, exit alarm on and call light tin reach  Therapy Documentation Precautions:   Precautions Precautions: Fall Precaution Comments: NG tube Restrictions Weight Bearing Restrictions: No Other Position/Activity Restrictions: HOB >30 degrees General:   Vital Signs:   Pain:   ADL: ADL Eating: NPO Grooming: Moderate assistance Where Assessed-Grooming: Edge of bed (suction toothbrush) Upper Body Bathing: Minimal assistance Where Assessed-Upper Body Bathing: Edge of bed Lower Body Bathing: Maximal assistance Where Assessed-Lower Body Bathing: Edge of bed Upper Body Dressing: Maximal cueing Where Assessed-Upper Body Dressing: Edge of bed Lower Body Dressing: Maximal assistance Where Assessed-Lower Body Dressing: Edge of bed Toileting: Maximal assistance (stedy) Where Assessed-Toileting: Bedside Commode Toilet Transfer: Moderate verbal cueing (stedy) Toilet Transfer Equipment: Bedside commode Vision   Perception    Praxis   Exercises:   Other Treatments:     Therapy/Group: Individual Therapy  Tonny Branch 03/17/2020, 3:27 PM

## 2020-03-17 NOTE — Progress Notes (Addendum)
Barium swallow completed this morning. Diet upgraded, full sup with meals. Tolerated diet/NTL well, no coughing. Meds also given by mouth- crushed in applesauce, tolerated well with no issues.   Patient became agitated saying that he needed his other phone and complaining that his phone would not charge. Staff attempted to reorient him, he continued to trigger bed alarm and became verbally aggressive towards staff regarding a phone and charger. Patients cell phone and charger both presently plugged in and within his reach. *
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
## 2020-03-17 NOTE — Progress Notes (Signed)
Physical Therapy Session Note  Patient Details  Name: Phillip Barrett MRN: [id number] Date of Birth: [birth year]  Today's Date: 03/17/2020 PT Individual Time: 1300-1345 PT Individual Time Calculation (min): 45 min   Short Term Goals: Week 2:  PT Short Term Goal 1 (Week 2): Patient will perform basic transfers with CGA using LRAD. PT Short Term Goal 2 (Week 2): Patient will ambulate >150 ft with min A using LRAD. PT Short Term Goal 3 (Week 2): Patient will perform 4 steps using B rails with min A.  Skilled Therapeutic Interventions/Progress Updates:     Patient in bed upon PT arrival. Patient alert and agreeable to PT session. Patient reported mild, unrated L knee pain during session when on knees, see NMR, resolved once patient was off his knees. PT provided repositioning, rest breaks, and distraction as pain interventions throughout session. Discussed patient's upgraded diet today, patient very happy about progressing to Dys I diet with thicken liquids.  Therapeutic Activity: Bed Mobility: Patient performed supine to/from sit with supervision-min A for trunk suport. Provided verbal cues for decreased use of bed rails. Transfers: Patient performed stand pivot x2 and sit to/from stand x1 with min A with PT blocking L knee to prevent buckling, no buckling noted. Provided verbal cues for hand placement of RW, sequencing and completion of turn during pivot, and controlled descent for safety. He performed squat pivot w/c<>mat table and w/c>bed with min A-CGA with cues and facilitation for hand placement and head-hips relationship.   Gait Training:  Patient ambulated 25 feet using RW with min A-CGA with PT blocking L knee to prevent buckling, noted some instability, but no buckling throughout. Ambulated with step-to gait pattern leading with L. Provided verbal cues for sequencing, increased quad and gluteal activation in L stance, increased L step length, erect posture, and looking ahead.  Wheelchair  Mobility:  Patient was transported in the w/c with total A throughout session for energy conservation and time management.  Neuromuscular Re-ed: Patient performed the following LE and trunk motor control activities: -performed seated on EOM<>half kneeling<>tall kneeling on floor mat with mod A and heavy cues for sequencing following demonstration, facilitated L LE for attaining R half kneeling from mat and hip extension in tall kneeling -performed tall kneeling focused on midline orientation and equal hip extension with R UE support on mat table progressing to no UE support -performed seated on heels to tall kneeling x2, limited by mild L knee pain  Patient in bed at end of session with breaks locked, bed alarm set, and all needs within reach.    Therapy Documentation Precautions:  Precautions Precautions: Fall Precaution Comments: NG tube Restrictions Weight Bearing Restrictions: No Other Position/Activity Restrictions: HOB >30 degrees   Therapy/Group: Individual Therapy  Tenelle Andreason L Noelene Gang PT, DPT  03/17/2020, 4:35 PM

## 2020-03-18 ENCOUNTER — Inpatient Hospital Stay (HOSPITAL_COMMUNITY): Payer: Medicare Other | Admitting: Physical Therapy

## 2020-03-18 ENCOUNTER — Inpatient Hospital Stay (HOSPITAL_COMMUNITY): Payer: Medicare Other | Admitting: Speech Pathology

## 2020-03-18 ENCOUNTER — Inpatient Hospital Stay (HOSPITAL_COMMUNITY): Payer: Medicare Other

## 2020-03-18 LAB — GLUCOSE, CAPILLARY
Glucose-Capillary: 100 mg/dL — ABNORMAL HIGH (ref 70–99)
Glucose-Capillary: 116 mg/dL — ABNORMAL HIGH (ref 70–99)
Glucose-Capillary: 171 mg/dL — ABNORMAL HIGH (ref 70–99)
Glucose-Capillary: 275 mg/dL — ABNORMAL HIGH (ref 70–99)
Glucose-Capillary: 87 mg/dL (ref 70–99)

## 2020-03-18 MED ORDER — ADULT MULTIVITAMIN W/MINERALS CH
1.0000 | ORAL_TABLET | Freq: Every day | ORAL | Status: DC
  Administered 2020-03-19 – 2020-03-25 (×7): 1 via ORAL
  Filled 2020-03-18 (×7): qty 1

## 2020-03-18 MED ORDER — QUETIAPINE FUMARATE 25 MG PO TABS
25.0000 mg | ORAL_TABLET | Freq: Two times a day (BID) | ORAL | Status: DC
  Administered 2020-03-18 – 2020-03-25 (×15): 25 mg via ORAL
  Filled 2020-03-18 (×15): qty 1

## 2020-03-18 MED ORDER — METOPROLOL TARTRATE 12.5 MG HALF TABLET
12.5000 mg | ORAL_TABLET | Freq: Two times a day (BID) | ORAL | Status: DC
  Administered 2020-03-18 – 2020-03-25 (×14): 12.5 mg via ORAL
  Filled 2020-03-18 (×14): qty 1

## 2020-03-18 MED ORDER — OXYCODONE HCL 5 MG PO TABS
5.0000 mg | ORAL_TABLET | Freq: Four times a day (QID) | ORAL | Status: DC | PRN
  Administered 2020-03-23 – 2020-03-24 (×2): 5 mg via ORAL
  Filled 2020-03-18 (×4): qty 1

## 2020-03-18 MED ORDER — METHYLPHENIDATE HCL 5 MG PO TABS
5.0000 mg | ORAL_TABLET | Freq: Two times a day (BID) | ORAL | Status: DC
  Administered 2020-03-18 – 2020-03-25 (×15): 5 mg via ORAL
  Filled 2020-03-18 (×16): qty 1

## 2020-03-18 MED ORDER — LACTULOSE 10 GM/15ML PO SOLN
10.0000 g | Freq: Every day | ORAL | Status: DC
  Administered 2020-03-19 – 2020-03-25 (×7): 10 g via ORAL
  Filled 2020-03-18 (×7): qty 15

## 2020-03-18 NOTE — Progress Notes (Signed)
Physical Therapy Session Note  Patient Details  Name: Phillip Barrett MRN: [id number] Date of Birth: [birth year]  Today's Date: 03/18/2020 PT Individual Time: 1006-1045 PT Individual Time Calculation (min): 39 min   Short Term Goals: Week 1:  PT Short Term Goal 1 (Week 1): Patient will perform basic transfers with mod A consistently. PT Short Term Goal 1 - Progress (Week 1): Met PT Short Term Goal 2 (Week 1): Patient will perform standing balance >2 min during functional activities with min A. PT Short Term Goal 2 - Progress (Week 1): Met PT Short Term Goal 3 (Week 1): Patient will ambulated >20 ft using LRAD with mod A. PT Short Term Goal 3 - Progress (Week 1): Met  Skilled Therapeutic Interventions/Progress Updates: Pt presents supine in bed and perseverating on need to remove tube from nose.  Pt required increased encouragement to participate w/ therapy, stating "you're forcing me."  Pt encouraged to perform therapy to increase mobility for D/C.  Pt transferred sup to sit w/ CGA using rails and then finally participated w/ CGA transfer bed > w/c.  Wheeled pt to gym for time conservation.  Pt performed multiple sit to stand transfers w/ min A and verbal and visual cues for hand placement.  Pt stood to perform cornhole w/ 1 UE support.  Pt upset w/ distance from cornhole, but eventually found range and performed multiple trials.  Pt amb w/ RW and min A to 25' w/ encouragement to increase distance.  No episodes of knee buckling.  Pt returned to room and transferred w/c > bed w/ CGA and use of side rail.  Pt transferred sit to supine w/ supervision and left w/ Rehab tech, giving all needs and turning on bed alarm.     Therapy Documentation Precautions:  Precautions Precautions: Fall Precaution Comments: NG tube Restrictions Weight Bearing Restrictions: No Other Position/Activity Restrictions: HOB >30 degrees General:   Vital Signs:   Pain:no pain. Pain Assessment Pain Scale: 0-10 Pain  Score: 0-No pain Mobility:   Locomotion :    Trunk/Postural Assessment :    Balance:   Exercises:   Other Treatments:      Therapy/Group: Individual Therapy  Ladoris Gene 03/18/2020, 12:46 PM

## 2020-03-18 NOTE — Progress Notes (Signed)
Occupational Therapy Session Note  Patient Details  Name: JEFFORY SNELGROVE MRN: [id number] Date of Birth: April 19, 1976  Today's Date: 03/18/2020 OT Individual Time: 0800-0900 OT Individual Time Calculation (min): 60 min    Short Term Goals: Week 1:  OT Short Term Goal 1 (Week 1): Pt will groom in standing wiht MIN A for improved activity tolerance OT Short Term Goal 1 - Progress (Week 1): Met OT Short Term Goal 2 (Week 1): pt will sit EOB/EOB during funcitonal activity for 5 min with S for dynamic sitting balnace OT Short Term Goal 2 - Progress (Week 1): Met OT Short Term Goal 3 (Week 1): Pt will locate 2/3 ADL items on L visual field for improved L attention wiht MIN VC OT Short Term Goal 3 - Progress (Week 1): Met OT Short Term Goal 4 (Week 1): Pt will transfer to toilet with MOD A of 1 caregiver OT Short Term Goal 4 - Progress (Week 1): Met  Skilled Therapeutic Interventions/Progress Updates:    1;1. Pt received in bed agreeable to OT and self feeding after increased time to arouse. Pt completes supine>sitting iwht HOB elevated slightly and bed rail S overall. Pt requires intervention from OT to make bites and sips smaller to decrease risk of aspiration per SLP plan. Pt states, "I dont want to take small bites, ive been eating for an hour." education on risk of aspiration and SLP guidance for diet instructions. Pt also c/o annoyance with NG tube getting in the way. OT assured pt if continues to eat well/swallow meds well eventually tube will be removed. Pt requires VC for decreasing phone use and turning off tv to limit distractions. Tray set up with items on L for attention and pt able ot manage all packages and containers with BUE for NMR. Exited session after suction oral care, exit alarm on and call light tin reach  Therapy Documentation Precautions:  Precautions Precautions: Fall Precaution Comments: NG tube Restrictions Weight Bearing Restrictions: No Other Position/Activity  Restrictions: HOB >30 degrees General:   Vital Signs: Therapy Vitals Temp: 98.2 F (36.8 C) Temp Source: Oral Pulse Rate: 85 Resp: 18 BP: 104/76 Patient Position (if appropriate): Lying Oxygen Therapy SpO2: 99 % O2 Device: Room Air Pain:   ADL: ADL Eating: NPO Grooming: Moderate assistance Where Assessed-Grooming: Edge of bed (suction toothbrush) Upper Body Bathing: Minimal assistance Where Assessed-Upper Body Bathing: Edge of bed Lower Body Bathing: Maximal assistance Where Assessed-Lower Body Bathing: Edge of bed Upper Body Dressing: Maximal cueing Where Assessed-Upper Body Dressing: Edge of bed Lower Body Dressing: Maximal assistance Where Assessed-Lower Body Dressing: Edge of bed Toileting: Maximal assistance (stedy) Where Assessed-Toileting: Bedside Commode Toilet Transfer: Moderate verbal cueing (stedy) Toilet Transfer Equipment: Bedside commode Vision   Perception    Praxis   Exercises:   Other Treatments:     Therapy/Group: Individual Therapy  Tonny Branch 03/18/2020, 8:57 AM

## 2020-03-18 NOTE — Progress Notes (Signed)
Speech Language Pathology Daily Session Note  Patient Details  Name: Phillip Barrett MRN: [id number] Date of Birth: [birth year]  Today's Date: 03/18/2020 SLP Individual Time: 1255-1350 SLP Individual Time Calculation (min): 55 min  Short Term Goals: Week 2: SLP Short Term Goal 1 (Week 2): Patient will consume current diet with minimal overt s/s of aspiration and Min verbal cues for use of swallowing compensatory strategies. SLP Short Term Goal 2 (Week 2): Patient will utilize speech intelligibility strategies at the sentence level with Mod A verbal cues to achieve ~75% intelligibility. SLP Short Term Goal 3 (Week 2): Patient will utilize external aids to recall new, daily information with Mod A verbal and visual cues. SLP Short Term Goal 4 (Week 2): Patient will demonstrate sustained attention to functional tasks for 30 minutes with Min verbal cues for redirection. SLP Short Term Goal 5 (Week 2): Patient will demonstrate functional problem solving for basic and familiar tasks with Mod verbal and visual cues.  Skilled Therapeutic Interventions: Skilled treatment session focused on cognitive and dysphagia goals. Upon arrival, patient perseverative on finding his "other phone." SLP provided education to patient that that is the only phone he has had here on the unit and quickly redirected to a functional task.  SLP facilitated session by providing skilled observation with lunch meal of Dys. 1 textures with nectar-thick liquids. Patient consumed meal with overt cough X 2, suspect due to large bites with Min-Mod A verbal cues needed for utilization of swallowing compensatory strategies. Recommend patient continue current diet with strict supervision. Throughout meal, patient with language of confusion and frequently checking to make sure he could see his phone. Mild verbal frustration noted during meal when reinforcing swallowing strategies. Patient left upright in bed with alarm on and all needs within  reach. Continue with current plan of care.      Pain No/Denies Pain   Therapy/Group: Individual Therapy  Celestia Duva 03/18/2020, 3:23 PM
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
## 2020-03-19 LAB — GLUCOSE, CAPILLARY
Glucose-Capillary: 140 mg/dL — ABNORMAL HIGH (ref 70–99)
Glucose-Capillary: 156 mg/dL — ABNORMAL HIGH (ref 70–99)
Glucose-Capillary: 195 mg/dL — ABNORMAL HIGH (ref 70–99)
Glucose-Capillary: 211 mg/dL — ABNORMAL HIGH (ref 70–99)
Glucose-Capillary: 83 mg/dL (ref 70–99)
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
## 2020-03-19 NOTE — Progress Notes (Signed)
Physical Therapy Session Note  Patient Details  Name: AVIYON HOCEVAR MRN: [id number] Date of Birth: 05/13/1976  Today's Date: 03/18/2020     Short Term Goals: Week 2:  PT Short Term Goal 1 (Week 2): Patient will perform basic transfers with CGA using LRAD. PT Short Term Goal 2 (Week 2): Patient will ambulate >150 ft with min A using LRAD. PT Short Term Goal 3 (Week 2): Patient will perform 4 steps using B rails with min A.   Skilled Therapeutic Interventions/Progress Updates:   Pt received supine in bed, asleep. Pt unable to be aroused. Pt missed 30 min of skilled therapy due to fatigue and lethargy. Pt will re-attempt at later time/date, schedule allowing.      Therapy Documentation Precautions:  Precautions Precautions: Fall Precaution Comments: NG tube Restrictions Weight Bearing Restrictions: No Other Position/Activity Restrictions: HOB >30 degrees Pain: Pain Assessment Pain Scale: 0-10 Pain Score: 0-No pain     Therapy/Group: Individual Therapy  Lorie Phenix 03/19/2020, 9:19 AM

## 2020-03-20 ENCOUNTER — Inpatient Hospital Stay (HOSPITAL_COMMUNITY): Payer: Medicare Other | Admitting: Speech Pathology

## 2020-03-20 ENCOUNTER — Inpatient Hospital Stay (HOSPITAL_COMMUNITY): Payer: Medicare Other

## 2020-03-20 LAB — GLUCOSE, CAPILLARY
Glucose-Capillary: 106 mg/dL — ABNORMAL HIGH (ref 70–99)
Glucose-Capillary: 143 mg/dL — ABNORMAL HIGH (ref 70–99)
Glucose-Capillary: 146 mg/dL — ABNORMAL HIGH (ref 70–99)
Glucose-Capillary: 157 mg/dL — ABNORMAL HIGH (ref 70–99)
Glucose-Capillary: 189 mg/dL — ABNORMAL HIGH (ref 70–99)
Glucose-Capillary: 197 mg/dL — ABNORMAL HIGH (ref 70–99)
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
## 2020-03-20 NOTE — Progress Notes (Signed)
Occupational Therapy Session Note  Patient Details  Name: Phillip Barrett MRN: [id number] Date of Birth: September 15, 1975  Today's Date: 03/20/2020 OT Individual Time: 1000-1100 OT Individual Time Calculation (min): 60 min    Short Term Goals: Week 2:  OT Short Term Goal 1 (Week 2): Pt will bathe with CGA only for standing balance washing 11/11 body parts without physical A OT Short Term Goal 2 (Week 2): Pt will thread BLE into pants without VC for adaptive strategies to demo improved problem solving OT Short Term Goal 3 (Week 2): Pt will transfer to toielt wiht MIN A and LRAD OT Short Term Goal 4 (Week 2): Pt will don B socks with CGA  Skilled Therapeutic Interventions/Progress Updates:    Pt received supine in good spirits, joking with OT. Pt completed bed mobility with min cueing for initiation and CGA, heavy use of bed rails. Sit> stand EOB with min A, using RW, good hand placement. Pt completed ambulatory transfer to the w/c with min A, no L knee buckling, RW use. Pt set up for oral care at the sink seated in w/c. Pt ambulated into the bathroom and transferred into shower with min stabilization assist. Pt required sequencing cues and cueing for initiation. Pt completed bathing seated on BSC with CGA. Min A for standing balance to wash peri areas. As fatigue increased pt began to have slight buckling of the L knee. Pt transferred from shower to EOB with very close min guarding for L knee buckling, min cueing for RW management. Pt donned shirt with set up assist and pants with min A seated EOB. Pt was given a cup of nectar thickened water which he consumed with cueing for slow pace and small sips, no overt s/s of aspiration. Pt completed stand pivot transfer to the w/c with min A. He was taken to the therapy gym via w/c for time management. He completed 5 min BUE strengthening on the SciFit arm ergometer at level 5.0, performed to increase independence with UB ADLs and grooming tasks overhead. Pt returned  to his room and was left supine with all needs met, bed alarm set.   Therapy Documentation Precautions:  Precautions Precautions: Fall Precaution Comments: NG tube Restrictions Weight Bearing Restrictions: No Other Position/Activity Restrictions: HOB >30 degrees   Therapy/Group: Individual Therapy  [name] 03/20/2020, 9:57 AM

## 2020-03-20 NOTE — Progress Notes (Signed)
Speech Language Pathology Daily Session Note  Patient Details  Name: Phillip Barrett MRN: [id number] Date of Birth: 08/01/1975  Today's Date: 03/20/2020 SLP Individual Time: 2992-4268 SLP Individual Time Calculation (min): 40 min  Short Term Goals: Week 2: SLP Short Term Goal 1 (Week 2): Patient will consume current diet with minimal overt s/s of aspiration and Min verbal cues for use of swallowing compensatory strategies. SLP Short Term Goal 2 (Week 2): Patient will utilize speech intelligibility strategies at the sentence level with Mod A verbal cues to achieve ~75% intelligibility. SLP Short Term Goal 3 (Week 2): Patient will utilize external aids to recall new, daily information with Mod A verbal and visual cues. SLP Short Term Goal 4 (Week 2): Patient will demonstrate sustained attention to functional tasks for 30 minutes with Min verbal cues for redirection. SLP Short Term Goal 5 (Week 2): Patient will demonstrate functional problem solving for basic and familiar tasks with Mod verbal and visual cues.  Skilled Therapeutic Interventions:  Pt was seen for skilled ST targeting cognitive and dysphagia goals.  Pt verbalizing frustration regarding bed alarm sensitivity upon therapist's arrival.  He stated he had been setting off his bed alarm due to sitting at the edge of bed to plug his phone into his charger.  SLP reviewed and reinforced safety precautions and rationale for setting bed alarm in light of pt's increased falls risk. SLP also offered to provide set up assistance with pt's table tray to ensure pt had everything he needed within his reach.  Pt directed therapist to move his tray table to the opposite side of his bed so that his phone could be placed there while on its charger which appeared to mitigate his frustration.  Pt also verbalized wanting to get out of the room to "see something different."  Pt was transported to treatment room where he completed 25 repetitions of EMST at 14 cm H2O  with supervision verbal cues for return demonstration.  Pt needed longer rest breaks in between sets to recover after the third set of 5 reps.  Pt visibly fatigued after exercises and declined pharyngeal strengthening exercises.  Pt was returned to room and left in bed with bed alar set and call bell within reach.  Continue per current plan of care.    Pain Pain Assessment Pain Scale: 0-10 Pain Score: 0-No pain  Therapy/Group: Individual Therapy  Tatyana Biber, Selinda Orion 03/20/2020, 3:03 PM

## 2020-03-21 ENCOUNTER — Inpatient Hospital Stay (HOSPITAL_COMMUNITY): Payer: Medicare Other | Admitting: Speech Pathology

## 2020-03-21 ENCOUNTER — Inpatient Hospital Stay (HOSPITAL_COMMUNITY): Payer: Medicare Other

## 2020-03-21 ENCOUNTER — Inpatient Hospital Stay (HOSPITAL_COMMUNITY): Payer: Medicare Other | Admitting: Occupational Therapy

## 2020-03-21 LAB — BASIC METABOLIC PANEL
Anion gap: 9 (ref 5–15)
BUN: 14 mg/dL (ref 6–20)
CO2: 25 mmol/L (ref 22–32)
Calcium: 9.1 mg/dL (ref 8.9–10.3)
Chloride: 106 mmol/L (ref 98–111)
Creatinine, Ser: 0.63 mg/dL (ref 0.61–1.24)
GFR calc Af Amer: >60 mL/min (ref >60)
GFR calc non Af Amer: >60 mL/min (ref >60)
Glucose, Bld: 136 mg/dL — ABNORMAL HIGH (ref 70–99)
Potassium: 3.9 mmol/L (ref 3.5–5.1)
Sodium: 140 mmol/L (ref 135–145)

## 2020-03-21 LAB — GLUCOSE, CAPILLARY
Glucose-Capillary: 118 mg/dL — ABNORMAL HIGH (ref 70–99)
Glucose-Capillary: 126 mg/dL — ABNORMAL HIGH (ref 70–99)
Glucose-Capillary: 196 mg/dL — ABNORMAL HIGH (ref 70–99)
Glucose-Capillary: 165 mg/dL — ABNORMAL HIGH (ref 70–99)
Glucose-Capillary: 159 mg/dL — ABNORMAL HIGH (ref 70–99)

## 2020-03-21 NOTE — Plan of Care (Signed)
  Problem: Consults Goal: RH BRAIN INJURY PATIENT EDUCATION Description: Description: See Patient Education module for eduction specifics Outcome: Progressing Goal: Nutrition Consult-if indicated Outcome: Progressing   Problem: RH BOWEL ELIMINATION Goal: RH STG MANAGE BOWEL WITH ASSISTANCE Description: STG Manage Bowel with min Assistance. Outcome: Progressing   Problem: RH BLADDER ELIMINATION Goal: RH STG MANAGE BLADDER WITH ASSISTANCE Description: STG Manage Bladder With min Assistance Outcome: Progressing Goal: RH STG MANAGE BLADDER WITH EQUIPMENT WITH ASSISTANCE Description: STG Manage Bladder With Equipment With min Assistance Outcome: Progressing   Problem: RH SKIN INTEGRITY Goal: RH STG SKIN FREE OF INFECTION/BREAKDOWN Description: Skin will remain free of infection/breakdown with min assist Outcome: Progressing Goal: RH STG MAINTAIN SKIN INTEGRITY WITH ASSISTANCE Description: STG Maintain Skin Integrity With min Assistance. Outcome: Progressing   Problem: RH SAFETY Goal: RH STG ADHERE TO SAFETY PRECAUTIONS W/ASSISTANCE/DEVICE Description: STG Adhere to Safety Precautions With min Assistance/Device. Outcome: Progressing   Problem: RH COGNITION-NURSING Goal: RH STG USES MEMORY AIDS/STRATEGIES W/ASSIST TO PROBLEM SOLVE Description: STG Uses Memory Aids/Strategies With min Assistance to Problem Solve. Outcome: Progressing   Problem: RH PAIN MANAGEMENT Goal: RH STG PAIN MANAGED AT OR BELOW PT'S PAIN GOAL Description: Pt will be able to verbalize pain less than 3 out of 10 with min assist  Outcome: Progressing   Problem: RH KNOWLEDGE DEFICIT BRAIN INJURY Goal: RH STG INCREASE KNOWLEDGE OF SELF CARE AFTER BRAIN INJURY Description: Pt and family will be able to identify 3 ways to improve pt safety awareness when returning home with min assist.  Outcome: Progressing

## 2020-03-21 NOTE — Progress Notes (Signed)
Occupational Therapy Session Note  Patient Details  Name: Phillip Barrett MRN: [id number] Date of Birth: 10/04/1975  Today's Date: 03/21/2020 OT Individual Time: 1000-1055 OT Individual Time Calculation (min): 55 min   Skilled Therapeutic Interventions/Progress Updates:    Pt greeted EOB with RN present, just finishing up with morning medication. He reported feeling sleepy but agreeable to shower. CGA for ambulatory transfer to 3:1 placed in shower using RW. He then bathed at sit<stand level using the grab bar, min cues for sequencing and Min A for washing feet and for thoroughness when washing buttocks. Noted some Lt knee buckling during dynamic standing activity in shower, pt stating his knee was "giving out." Stand pivot<w/c completed with CGA out of shower for safety. He then dressed at sit<stand level while sitting at the sink, CGA for dynamic standing balance. He required Min A for donning gripper socks using figure 4 position bilaterally. Tried using the extra wide sock aide however pt with increased frustration when OT was providing him with gentle instruction for setup. OT set up brief like underwear today and pt was able to thread both LEs into pants and elevate with assistance for adjusting fit. Vcs for using soap during handwashing afterwards. Stand pivot>flat bed without bedrails completed using CGA without AD. Pt transferred into flat bed with setup assistance. Left him repositioned in bed for comfort and left with all needs within reach, bed alarm set.   Therapy Documentation Precautions:  Precautions Precautions: Fall Precaution Comments: NG tube Restrictions Weight Bearing Restrictions: No Other Position/Activity Restrictions: HOB >30 degrees Vital Signs: Therapy Vitals Pulse Rate: 94 BP: 118/72 Pain: pt reported rib pain this AM but during tx this had absolved and pt denied having pain   ADL: ADL Eating: NPO Grooming: Moderate assistance Where Assessed-Grooming: Edge of  bed (suction toothbrush) Upper Body Bathing: Minimal assistance Where Assessed-Upper Body Bathing: Edge of bed Lower Body Bathing: Maximal assistance Where Assessed-Lower Body Bathing: Edge of bed Upper Body Dressing: Maximal cueing Where Assessed-Upper Body Dressing: Edge of bed Lower Body Dressing: Maximal assistance Where Assessed-Lower Body Dressing: Edge of bed Toileting: Maximal assistance (stedy) Where Assessed-Toileting: Bedside Commode Toilet Transfer: Moderate verbal cueing (stedy) Toilet Transfer Equipment: Bedside commode     Therapy/Group: Individual Therapy  Perina Salvaggio A Kesa Birky 03/21/2020, 12:21 PM

## 2020-03-21 NOTE — Progress Notes (Signed)
Physical Therapy Session Note  Patient Details  Name: Phillip Barrett MRN: [id number] Date of Birth: 16-Dec-1975  Today's Date: 03/21/2020 PT Individual Time: 1438-8875 PT Individual Time Calculation (min): 45 min   Short Term Goals: Week 2:  PT Short Term Goal 1 (Week 2): Patient will perform basic transfers with CGA using LRAD. PT Short Term Goal 2 (Week 2): Patient will ambulate >150 ft with min A using LRAD. PT Short Term Goal 3 (Week 2): Patient will perform 4 steps using B rails with min A.  Skilled Therapeutic Interventions/Progress Updates:    Pt tending to perseverate throughout session on staff not allowing him to get up and move around in room, requiring frequent redirection and education.   assist with use of bedrail for supine to sit with CGA to come to EOB and cues for improved technique. Min assist for stand step transfer with RW to w/c throughout session with overall assist for balance and support.  NMR to focus on LLE coordination and strength. Attempted toe taps to 4" step with RLE for increased stance control training on LLE but pt reluctant to try. Decreased height of step to 2" to increase confidence and started with stepping in place. Pt was able to perform 3 reps total with max encouragement and seated rest breaks between each trial (intial goal was to perform 5 in a row). Required min assist for balance and no episode of knee buckling. Pt able to perform 5 with LLE on 2" step with min assist. Declined progressing to gait or stair negotiation stating then he wouldn't be able to do it later with the other PT. Education performed and focused on lateral sidestepping with RW to L and R x 6' each direction with min assist and cues for placement of RW and foot positioning for safety.  End of session declined short distance gait back to bed for same reason and declined need for toileting. Performed transfer to bed with min assist and returned to supine with supervision.   Therapy  Documentation Precautions:  Precautions Precautions: Fall  Pain:  Denies pain   Therapy/Group: Individual Therapy  Canary Brim Ivory Broad, PT, DPT, CBIS  03/21/2020, 3:05 PM

## 2020-03-21 NOTE — Progress Notes (Signed)
Physical Therapy Session Note  Patient Details  Name: Phillip Barrett MRN: [id number] Date of Birth: [birth year]  Today's Date: 03/21/2020 PT Individual Time: [phone] and 1445-1535 PT Individual Time Calculation (min): 43 min and 50 min  Short Term Goals: Week 2:  PT Short Term Goal 1 (Week 2): Patient will perform basic transfers with CGA using LRAD. PT Short Term Goal 2 (Week 2): Patient will ambulate >150 ft with min A using LRAD. PT Short Term Goal 3 (Week 2): Patient will perform 4 steps using B rails with min A.  Skilled Therapeutic Interventions/Progress Updates:     Session 1: Patient in bed asleep upon PT arrival. Patient initially did not arouse to verbal stimulation, but did arouse to tactile stiulation and agreeable to PT session. Patient reported mild-moderate L rib pain with mobility during session, RN made aware. PT provided repositioning, rest breaks, and distraction as pain interventions throughout session.   NT brought patient's breakfast at beginning of session. Focused session on eating breakfast EOB and patient education. Patient sat EOB with supervision in a flat bed with min use of bed rails. Patient sat EOB to eat breakfast with supervision-mod I for sitting balance throughout session. Provided cues for swallowing techniques (chin tuck, throat clear and dry swallow between bites/sips, small bites). Reduced distractions with door closed and TV off. Educated patient on SNF placement and expectations for therapy and goals prior to moving in with his father. Patient aware of and agreeable to d/c plan. Patient stated I need to return to work. PT discussed barriers to returning work and educated patient of deficits and current goals with therapy. Encouraged focus on recovery at this time and he could progress to returning to an appropriate job once recovered. 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   Patient eating with his R hand, reports that he is L handed.  Encouraged use of his L hand. Patient able to take bites with a fork in his L hand, but slow and deliberate movements and reports numbness or L hand and arm. Educated on deficit from TBI and encouraged use of L hand as much as possible for improved recovery, however, patient switched back to his R hand due to impatience/poor frustration tolerance with use of his L hand. PT reinforced education without change in patient's behavior.   Patient reported feeling disoriented to his surroundings this morning, stating "I am in a different room." Provided cues for patient to utilized signs and surroundings to reorient to his room and where he was. Patient able to recall he is in the hospital and use of signs for stating the city and date. Patient read the time from the clock, however, usure if it was am/pm, utilized sun coming from window as cue for patient to determine it was morning.   Patient finished ~50% of his breakfast and asked to return to lying down. Encouraged patient to sit up after eating, patient declined. Had patient perform several dry swallows before returning to lying with supervision in a flat bed without use of bed rails.  Patient in bed at end of session with breaks locked, bed alarm set, and all needs within reach.   Session 2: Patient in bed asleep upon PT arrival. Patient did not arouse inititially, required verbal and tactile stimulation and increased time to arouse. Patient initially stated that staff would not let him get up and walk earlier. Discussed if patient meant nursing or therapy staff and educated on the different roles of  each. Patient initially stated that he was too tired to get up at this time. With increased encouragement patient was then agreeable to PT session. Patient denied pain during session. Focused session on increased gait distance and L knee stability with gait.   Therapeutic Activity: Bed Mobility: Patient performed supine to sit with min A due to increased  fatigue this afternoon and supervision for sit to supine. Provided assist and encouragement throughout due to patient fatigue. Transfers: Patient performed sit to/from stand x2 with CGA requiring 2 trials each time due to decreased forward weight shift. Provided verbal cues for scooting forward and forward weight shift to stand.  Gait Training:  Patient ambulated 52 feet x2 and 15 ft to the bathroom x1 using RW with CGA for balance and blocking L knee, also providing tactile cues to L knee for increased quad and gluteal activation in stance for improved knee stability. Ambulated with step-through gait pattern, decreased step length and stance time on L, mild L knee instability with fatigue without buckling, decreased step height L>R, forward trunk lean, and significantly decreased gait speed . Provided heavy encouragment and positive reinforcement throughout. Provided seated rest break between trials. Had patient ambulate to the w/c today rather than have w/c follow for increased gait distance.  Patient was mildly resistive to all mobility initially, however, after session reported that he was glad for the encouragement and looking forward to working hard tomorrow. Patient even laughing with therapist during session and encouraged by progress after.   Patient on the toilet when handed off to NT at end of session.    Therapy Documentation Precautions:  Precautions Precautions: Fall Precaution Comments: NG tube Restrictions Weight Bearing Restrictions: No Other Position/Activity Restrictions: HOB >30 degrees   Therapy/Group: Individual Therapy  Matison Nuccio L Boots Mcglown PT, DPT  03/21/2020, 4:17 PM
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
## 2020-03-22 ENCOUNTER — Inpatient Hospital Stay (HOSPITAL_COMMUNITY): Payer: Medicare Other

## 2020-03-22 LAB — GLUCOSE, CAPILLARY
Glucose-Capillary: 114 mg/dL — ABNORMAL HIGH (ref 70–99)
Glucose-Capillary: 143 mg/dL — ABNORMAL HIGH (ref 70–99)
Glucose-Capillary: 182 mg/dL — ABNORMAL HIGH (ref 70–99)
Glucose-Capillary: 151 mg/dL — ABNORMAL HIGH (ref 70–99)
Glucose-Capillary: 171 mg/dL — ABNORMAL HIGH (ref 70–99)

## 2020-03-22 MED ORDER — RESOURCE THICKENUP CLEAR PO POWD
ORAL | Status: DC | PRN
  Filled 2020-03-22: qty 125

## 2020-03-22 NOTE — Patient Care Conference (Signed)
Inpatient RehabilitationTeam Conference and Plan of Care Update Date: 03/22/2020   Time: 10:13 AM    Patient Name: Phillip Barrett      Medical Record Number: [id number]  Date of Birth: [birth year] Sex: Male         Room/Bed: 4W09C/4W09C-01 Payor Info: Payor: MEDICARE / Plan: MEDICARE PART A AND B / Product Type: *No Product type* /    Admit Date/Time:  03/08/2020  2:36 PM  Primary Diagnosis:  TBI (traumatic brain injury) [facility])  Hospital Problems: Principal Problem:   TBI (traumatic brain injury) ([place]) Active Problems:   Type 2 diabetes mellitus not at goal [facility])   [place] (subarachnoid hemorrhage) [facility])    Expected Discharge Date: Expected Discharge Date:  (SNF Placement)  Team Members Present: Physician leading conference: Dr. Alger Simons Care Coodinator Present: Loralee Pacas, LCSWA;Subrena Devereux Creig Hines, RN, BSN, [place] Nurse Present: Toy Cookey, LPN PT Present: Apolinar Junes, PT OT Present: Laverle Hobby, OT SLP Present: [name], SLP PPS Coordinator present : Ileana Ladd, PT     Current Status/Progress Goal Weekly Team Focus  Bowel/Bladder   Continent x2; last BM 8/23  Remain continent  Assess every shift and as needed   Swallow/Nutrition/ Hydration             ADL's   Min A LB ADLs, (S) UB ADLs, min A ADL transfers, L buckling improved, cognition improved  Supervision overall  ADL retraining, motor planning, cognitive retraining, ADL transfers   Mobility   Min A-CGA overall  Supervision overall  Balance, functional mobility, gait and stair training, L UE/LE NMR and strengthening, activity tolerance, attention, awareness, recall, cognition, safety awareness   Communication             Safety/Cognition/ Behavioral Observations            Pain   Pain 5/10 to left ribs, patient declined intervention  Pain <3  Assess every shift and as needed   Skin   Skin is intact  Prevent breakdown  Assess every shift and as needed     Discharge Planning:  D/c to  SNF pending bed offer   Team Discussion: Continent B/B, Min assist ADL's with supervision goals. Contact guard-min assist transfers/ambulations with supervision goals. Left knee will buckle at times. SLP - Modified swallow study, Dys 1/Nectar. Patient on target to meet rehab goals: yes  *See Care Plan and progress notes for long and short-term goals.   Revisions to Treatment Plan:  None  Teaching Needs: Continue with education  Current Barriers to Discharge: Behavior and Lethargy  Possible Resolutions to Barriers: Give patient rest breaks and try to keep patient up in between therapy sessions.     Medical Summary Current Status: irritable, sometimes perseverative behavior. eating well. labs wnl  Barriers to Discharge: Behavior   Possible Resolutions to Barriers/Weekly Focus: adjusting meds for behavior, maximizing po intake   Continued Need for Acute Rehabilitation Level of Care: The patient requires daily medical management by a physician with specialized training in physical medicine and rehabilitation for the following reasons: Direction of a multidisciplinary physical rehabilitation program to maximize functional independence : Yes Medical management of patient stability for increased activity during participation in an intensive rehabilitation regime.: Yes Analysis of laboratory values and/or radiology reports with any subsequent need for medication adjustment and/or medical intervention. : Yes   I attest that I was present, lead the team conference, and concur with the assessment and plan of the team.   Cristi Loron 03/22/2020,  1:44 PM

## 2020-03-22 NOTE — Progress Notes (Signed)
Occupational Therapy Session Note  Patient Details  Name: Phillip Barrett MRN: [id number] Date of Birth: Apr 10, 1976  Today's Date: 03/22/2020 OT Individual Time: 0850-1000 OT Individual Time Calculation (min): 70 min    Short Term Goals: Week 2:  OT Short Term Goal 1 (Week 2): Pt will bathe with CGA only for standing balance washing 11/11 body parts without physical A OT Short Term Goal 2 (Week 2): Pt will thread BLE into pants without VC for adaptive strategies to demo improved problem solving OT Short Term Goal 3 (Week 2): Pt will transfer to toielt wiht MIN A and LRAD OT Short Term Goal 4 (Week 2): Pt will don B socks with CGA  Skilled Therapeutic Interventions/Progress Updates:    Pt received supine with no c/o pain. Pt slow to get moving but joking with therapist this morning and in good spirits. Pt completed bed mobility with heavy use of bed rails but no other physical assist needed. He stood from EOB with min A, use of RW. Pt completed ambulatory transfer to the sink with min A where he then sat in a standard chair and completed oral care with set up assist. Pt transferred into shower with min A, sitting on BSC. Pt then reported he needed to use the toilet and transferred out of shower back to toilet with min A, with RW. Good RW management during turning, with min cueing for sequencing. Pt required extra time to sit and attempt to void BM. Pt requested a coffee and one was obtained and thickened to nectar thick for him. Pt transferred into shower with min A. Pt completed bathing sit <> stand from the [facility] with good use of grab bars and min A when standing. Tactile input provided to prevent buckling of the L knee-no full LOB but intermittent snapping in and out of extension. Pt transferred his TIS w/c with min A, ambulatory transfer with RW. Pt completed UB dressing with set up assist and LB dressing with min A. Pt was left sitting up with all needs met, chair alarm set.   Therapy  Documentation Precautions:  Precautions Precautions: Fall Precaution Comments: NG tube Restrictions Weight Bearing Restrictions: No Other Position/Activity Restrictions: HOB >30 degrees  Therapy/Group: Individual Therapy  [name] 03/22/2020, 6:57 AM

## 2020-03-22 NOTE — Progress Notes (Signed)
Physical Therapy Session Note  Patient Details  Name: Phillip Barrett MRN: [id number] Date of Birth: 12/12/1975  Today's Date: 03/22/2020 PT Individual Time: 1102-1205 PT Individual Time Calculation (min): 63 min   Short Term Goals: Week 2:  PT Short Term Goal 1 (Week 2): Patient will perform basic transfers with CGA using LRAD. PT Short Term Goal 2 (Week 2): Patient will ambulate >150 ft with min A using LRAD. PT Short Term Goal 3 (Week 2): Patient will perform 4 steps using B rails with min A.  Skilled Therapeutic Interventions/Progress Updates:     Patient seated in TIS w/c with RN in the room upon PT arrival. Patient alert and agreeable to PT session. Patient denied pain during session. Reported frustration about nursing staff being unable to allow him to ambulate in the room and get him back to bed. Educated on restrictions with mobility for safety, and agreed to focus session on gait training for increased daily ambulation. Also, discussed importance of sitting OOB during the day to reduce daytime sleep and improve OOB tolerance. Patient stated understanding and reported feeling less frustrated following discussion/education.   Therapeutic Activity: Bed Mobility: Patient performed sit to supine with supervision with min use of bed rail.  Transfers: Patient performed sit to/from stand x4 and stand pivot x1 with CGA-min A using RW. Provided verbal cues for forward weight shift and hand placement on RW.  Gait Training:  Patient ambulated 25 feet and 75 feet using RW with CGA with w/c follow due to decreased activity tolerance. Ambulated with step-through gait pattern with decreased step length and stance time on L, intermittent knee instability with fatigue without signs of buckling, forward trunk lean with downward head gaze, and significantly decreased gait speed. Provided verbal cues for erect posture, looking ahead with visual scanning of environment, increased gait speed, increased quad  and gluteal activation on L in stance, and increased step length on L. Provided consistent encouragement and positive reinforcement throughout gait training.  Patient ascended/descended 8-3" steps and 4-6" steps using B rails with min A of 1 person with a second person SBA for safety. Performed step-to gait pattern leading with R while ascending and L while descending. Provided cues for technique and sequencing throughout. Noted decreased L knee instability when descending, improved with cues for gluteal and quad activation and with repetition.   Wheelchair Mobility:  Patient was transported in the w/c with total A throughout session for energy conservation and time management.  Patient in bed at end of session with breaks locked, bed alarm set, and all needs within reach. Cleared nursing to ambulate patient to/from the bathroom and in the room using the RW, RN and NT made aware and safety sheet updated.    Therapy Documentation Precautions:  Precautions Precautions: Fall Precaution Comments: NG tube Restrictions Weight Bearing Restrictions: No Other Position/Activity Restrictions: HOB >30 degrees   Therapy/Group: Individual Therapy  Ailey Wessling L Taishawn Smaldone PT, DPT  03/22/2020, 12:45 PM

## 2020-03-22 NOTE — Plan of Care (Signed)
  Problem: Consults Goal: RH BRAIN INJURY PATIENT EDUCATION Description: Description: See Patient Education module for eduction specifics Outcome: Progressing Goal: Nutrition Consult-if indicated Outcome: Progressing   Problem: RH BOWEL ELIMINATION Goal: RH STG MANAGE BOWEL WITH ASSISTANCE Description: STG Manage Bowel with min Assistance. Outcome: Progressing   Problem: RH BLADDER ELIMINATION Goal: RH STG MANAGE BLADDER WITH ASSISTANCE Description: STG Manage Bladder With min Assistance Outcome: Progressing Goal: RH STG MANAGE BLADDER WITH EQUIPMENT WITH ASSISTANCE Description: STG Manage Bladder With Equipment With min Assistance Outcome: Progressing   Problem: RH SKIN INTEGRITY Goal: RH STG SKIN FREE OF INFECTION/BREAKDOWN Description: Skin will remain free of infection/breakdown with min assist Outcome: Progressing Goal: RH STG MAINTAIN SKIN INTEGRITY WITH ASSISTANCE Description: STG Maintain Skin Integrity With min Assistance. Outcome: Progressing   Problem: RH SAFETY Goal: RH STG ADHERE TO SAFETY PRECAUTIONS W/ASSISTANCE/DEVICE Description: STG Adhere to Safety Precautions With min Assistance/Device. Outcome: Progressing   Problem: RH COGNITION-NURSING Goal: RH STG USES MEMORY AIDS/STRATEGIES W/ASSIST TO PROBLEM SOLVE Description: STG Uses Memory Aids/Strategies With min Assistance to Problem Solve. Outcome: Progressing   Problem: RH PAIN MANAGEMENT Goal: RH STG PAIN MANAGED AT OR BELOW PT'S PAIN GOAL Description: Pt will be able to verbalize pain less than 3 out of 10 with min assist  Outcome: Progressing   Problem: RH KNOWLEDGE DEFICIT BRAIN INJURY Goal: RH STG INCREASE KNOWLEDGE OF SELF CARE AFTER BRAIN INJURY Description: Pt and family will be able to identify 3 ways to improve pt safety awareness when returning home with min assist.  Outcome: Progressing
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
## 2020-03-22 NOTE — Progress Notes (Signed)
Patient ID: Phillip Barrett, male   DOB: May 23, 1976, 44 y.o.   MRN: [id number]   SW left message for Wanda/Admissions with [facility] [phone]) to discuss bed offer. SW waiting on follow-up.   Loralee Pacas, MSW, [place] Office: [phone] Cell: [phone] Fax: [phone]

## 2020-03-23 ENCOUNTER — Inpatient Hospital Stay (HOSPITAL_COMMUNITY): Payer: Medicare Other

## 2020-03-23 ENCOUNTER — Inpatient Hospital Stay (HOSPITAL_COMMUNITY): Payer: Medicare Other | Admitting: Speech Pathology

## 2020-03-23 LAB — GLUCOSE, CAPILLARY
Glucose-Capillary: 135 mg/dL — ABNORMAL HIGH (ref 70–99)
Glucose-Capillary: 156 mg/dL — ABNORMAL HIGH (ref 70–99)
Glucose-Capillary: 123 mg/dL — ABNORMAL HIGH (ref 70–99)
Glucose-Capillary: 219 mg/dL — ABNORMAL HIGH (ref 70–99)

## 2020-03-23 NOTE — Progress Notes (Signed)
+/-   sleep. No attempts to get OOB without assistance. Drank 1, nectar water and 1 nectar apple juice. Needed cues to slow down. Patrici Ranks A

## 2020-03-23 NOTE — Progress Notes (Signed)
Occupational Therapy Weekly Progress Note  Patient Details  Name: Phillip Barrett MRN: [id number] Date of Birth: [birth year]  Beginning of progress report period: March 16, 2020 End of progress report period: March 23, 2020  Today's Date: 03/23/2020 OT Individual Time: 9381-8299 OT Individual Time Calculation (min): 55 min    Patient has met 3 of 4 short term goals.  Pt has made steady progress this reporting period progressing towards ambulatory transfers for mobility with RW and MIN A overall d/t continued L knee instability with weight bearing. Pt continues to require VC for adaptive dressing strategies and S for UB dressing/MIN A for LB dressing. Pt often perseverates on phone use/charger requiring VC for redirection to therapeutic tasks.   Patient continues to demonstrate the following deficits: muscle weakness, decreased cardiorespiratoy endurance, impaired timing and sequencing, abnormal tone, unbalanced muscle activation and decreased coordination, decreased attention to left, decreased attention, decreased awareness, decreased problem solving, decreased safety awareness, decreased memory and delayed processing and decreased sitting balance, decreased standing balance, decreased postural control, hemiplegia and decreased balance strategies and therefore will continue to benefit from skilled OT intervention to enhance overall performance with BADL and iADL.  Patient progressing toward long term goals..  Continue plan of care.  OT Short Term Goals Week 2:  OT Short Term Goal 1 (Week 2): Pt will bathe with CGA only for standing balance washing 11/11 body parts without physical A OT Short Term Goal 1 - Progress (Week 2): Met OT Short Term Goal 2 (Week 2): Pt will thread BLE into pants without VC for adaptive strategies to demo improved problem solving OT Short Term Goal 2 - Progress (Week 2): Progressing toward goal OT Short Term Goal 3 (Week 2): Pt will transfer to toielt wiht MIN A and  LRAD OT Short Term Goal 3 - Progress (Week 2): Met OT Short Term Goal 4 (Week 2): Pt will don B socks with CGA OT Short Term Goal 4 - Progress (Week 2): Met Week 3:  OT Short Term Goal 1 (Week 3): Pt will bathe with S to demo improved knee stability/strengthening with dynamic standing OT Short Term Goal 2 (Week 3): Pt will don B socks with min VC for adaptive strategies OT Short Term Goal 3 (Week 3): Pt will transfer to toilet wiht CGA and LRAD OT Short Term Goal 4 (Week 3): Pt will thread BLE into pants without VC for adaptive strategies to demo improved problem solving  Skilled Therapeutic Interventions/Progress Updates:    1:1. Pt received in bed agreeable to OT after encouragement to take a shower. Pt completes supine>sitting with heavy use of bed rails. Pt completes ambulation to/from bathroom with MIN A and improved (but still present) L knee instability with WB using RW. Pt completes bathing with supervision and min VC for bathing UB seated. Pt dresses UB with setup and pants/socks with VC for problem solving/adpative strategies. Pt completes box and blocks LUE: 21; 9HPT RUE 46 sec LUE 1 min 25 sec and dynamometer RUE 40# LUE 33#. Exited sesionw iht pt seated in bed, exit alarm on and call lightin reach  Therapy Documentation Precautions:  Precautions Precautions: Fall Precaution Comments: NG tube Restrictions Weight Bearing Restrictions: No Other Position/Activity Restrictions: HOB >30 degrees General:   Vital Signs: Therapy Vitals Temp: 98.2 F (36.8 C) Pulse Rate: 81 Resp: 17 BP: 120/86 Patient Position (if appropriate): Lying Oxygen Therapy SpO2: 100 % O2 Device: Room Air Pain:   ADL: ADL Eating: NPO Grooming: Moderate assistance Where  Assessed-Grooming: Edge of bed (suction toothbrush) Upper Body Bathing: Minimal assistance Where Assessed-Upper Body Bathing: Edge of bed Lower Body Bathing: Maximal assistance Where Assessed-Lower Body Bathing: Edge of bed Upper  Body Dressing: Maximal cueing Where Assessed-Upper Body Dressing: Edge of bed Lower Body Dressing: Maximal assistance Where Assessed-Lower Body Dressing: Edge of bed Toileting: Maximal assistance (stedy) Where Assessed-Toileting: Bedside Commode Toilet Transfer: Moderate verbal cueing (stedy) Toilet Transfer Equipment: Bedside commode Vision   Perception    Praxis   Exercises:   Other Treatments:     Therapy/Group: Individual Therapy  Tonny Branch 03/23/2020, 6:46 AM
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
## 2020-03-23 NOTE — Plan of Care (Signed)
  Problem: Consults Goal: RH BRAIN INJURY PATIENT EDUCATION Description: Description: See Patient Education module for eduction specifics Outcome: Progressing Goal: Nutrition Consult-if indicated Outcome: Progressing   Problem: RH BOWEL ELIMINATION Goal: RH STG MANAGE BOWEL WITH ASSISTANCE Description: STG Manage Bowel with min Assistance. Outcome: Progressing   Problem: RH BLADDER ELIMINATION Goal: RH STG MANAGE BLADDER WITH ASSISTANCE Description: STG Manage Bladder With min Assistance Outcome: Progressing Goal: RH STG MANAGE BLADDER WITH EQUIPMENT WITH ASSISTANCE Description: STG Manage Bladder With Equipment With min Assistance Outcome: Progressing   Problem: RH SKIN INTEGRITY Goal: RH STG SKIN FREE OF INFECTION/BREAKDOWN Description: Skin will remain free of infection/breakdown with min assist Outcome: Progressing Goal: RH STG MAINTAIN SKIN INTEGRITY WITH ASSISTANCE Description: STG Maintain Skin Integrity With min Assistance. Outcome: Progressing   Problem: RH SAFETY Goal: RH STG ADHERE TO SAFETY PRECAUTIONS W/ASSISTANCE/DEVICE Description: STG Adhere to Safety Precautions With min Assistance/Device. Outcome: Progressing   Problem: RH COGNITION-NURSING Goal: RH STG USES MEMORY AIDS/STRATEGIES W/ASSIST TO PROBLEM SOLVE Description: STG Uses Memory Aids/Strategies With min Assistance to Problem Solve. Outcome: Progressing   Problem: RH PAIN MANAGEMENT Goal: RH STG PAIN MANAGED AT OR BELOW PT'S PAIN GOAL Description: Pt will be able to verbalize pain less than 3 out of 10 with min assist  Outcome: Progressing   Problem: RH KNOWLEDGE DEFICIT BRAIN INJURY Goal: RH STG INCREASE KNOWLEDGE OF SELF CARE AFTER BRAIN INJURY Description: Pt and family will be able to identify 3 ways to improve pt safety awareness when returning home with min assist.  Outcome: Progressing
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
## 2020-03-23 NOTE — Progress Notes (Signed)
Speech Language Pathology Daily Session Note  Patient Details  Name: CHADWIN FURY MRN: [id number] Date of Birth: Jan 07, 1976  Today's Date: 03/23/2020 SLP Individual Time: 1410-3013 SLP Individual Time Calculation (min): 55 min  Short Term Goals: Week 2: SLP Short Term Goal 1 (Week 2): Patient will consume current diet with minimal overt s/s of aspiration and Min verbal cues for use of swallowing compensatory strategies. SLP Short Term Goal 2 (Week 2): Patient will utilize speech intelligibility strategies at the sentence level with Mod A verbal cues to achieve ~75% intelligibility. SLP Short Term Goal 3 (Week 2): Patient will utilize external aids to recall new, daily information with Mod A verbal and visual cues. SLP Short Term Goal 4 (Week 2): Patient will demonstrate sustained attention to functional tasks for 30 minutes with Min verbal cues for redirection. SLP Short Term Goal 5 (Week 2): Patient will demonstrate functional problem solving for basic and familiar tasks with Mod verbal and visual cues.  Skilled Therapeutic Interventions: Skilled treatment session focused on cognitive goals. SLP facilitated session providing Mod I for problem solving during a 4-step picture sequencing task and overall supervision level verbal cues for problem solving during a 6-step picture sequencing task. Patient required overall Min A verbal cues for sustained attention to task due to intermittently attending to his cell phone. Patient left upright in bed with alarm on and all needs within reach. Continue with current plan of care.      Pain No/Denies Pain   Therapy/Group: Individual Therapy  Mouhamad Teed 03/23/2020, 12:29 PM

## 2020-03-23 NOTE — NC FL2 (Signed)
[place] LEVEL OF CARE SCREENING TOOL     IDENTIFICATION  Patient Name: Phillip Barrett Birthdate: November 07, 1975 Sex: male Admission Date (Current Location): 03/08/2020  [place] and [place] Number:  [place] [id number] [place] and Address:  [facility] [place], [place] [address] [place], [place] [address]      Provider Number: [id number]  Attending Physician Name and Address:  Meredith Staggers, MD  Relative Name and Phone Number:  Declan Mier (father)[phone]    Current Level of Care: Hospital Recommended Level of Care: [place] Prior Approval Number:    Date Approved/Denied:   PASRR Number: [id number]  Discharge Plan: SNF    Current Diagnoses: Patient Active Problem List   Diagnosis Date Noted  . SAH (subarachnoid hemorrhage) ([place]) 02/25/2020  . TBI (traumatic brain injury) ([place]) 02/25/2020  . Closed fracture of nasal bones   . Post concussion syndrome   . Confusion and disorientation 10/30/2019  . Falls 10/29/2019  . Polysubstance abuse ---Cocaine 10/29/2019  . Current smoker 11/23/2016  . Vitamin D deficiency 11/24/2014  . Hyperlipidemia 04/01/2014  . Obesity (BMI 30-39.9) 04/01/2014  . Type 2 diabetes mellitus not at goal [facility]) 03/09/2014    Orientation RESPIRATION BLADDER Height & Weight     Self, Time, Place  Normal Continent Weight: 214 lb 4.6 oz (97.2 kg) Height:  5\' 7"  (170.2 cm)  BEHAVIORAL SYMPTOMS/MOOD NEUROLOGICAL BOWEL NUTRITION STATUS      Continent Diet (D1 nectak thick liquids)  AMBULATORY STATUS COMMUNICATION OF NEEDS Skin   Limited Assist Verbally Normal                       Personal Care Assistance Level of Assistance    Bathing Assistance: Limited assistance Feeding assistance: Limited assistance Dressing Assistance: Limited assistance     Functional Limitations Info  Speech     Speech Info: Impaired (slurred speech but able to understand)    SPECIAL CARE FACTORS  FREQUENCY  PT (By licensed PT), OT (By licensed OT), Speech therapy     PT Frequency: 5xs per week OT Frequency: 5xs per week     Speech Therapy Frequency: 5xs per week      Contractures Contractures Info: Not present    Additional Factors Info  Code Status Code Status Info: Full Allergies Info: NKA           Current Medications (03/23/2020):  This is the current hospital active medication list Current Facility-Administered Medications  Medication Dose Route Frequency Provider Last Rate Last Admin  . alum & mag hydroxide-simeth (MAALOX/MYLANTA) 200-200-20 MG/5ML suspension 30 mL  30 mL Oral Q4H PRN Love, Pamela S, PA-C      . bisacodyl (DULCOLAX) suppository 10 mg  10 mg Rectal Daily PRN Love, Pamela S, PA-C      . chlorhexidine (PERIDEX) 0.12 % solution 15 mL  15 mL Mouth Rinse BID Bary Leriche, PA-C   15 mL at 03/23/20 5035  . dextromethorphan-guaiFENesin (MUCINEX DM) 30-600 MG per 12 hr tablet 1 tablet  1 tablet Oral BID Meredith Staggers, MD   1 tablet at 03/23/20 [phone]  . diphenhydrAMINE (BENADRYL) 12.5 MG/5ML elixir 12.5-25 mg  12.5-25 mg Per Tube Q6H PRN Bary Leriche, PA-C   25 mg at 03/19/20 1619  . insulin aspart (novoLOG) injection 0-15 Units  0-15 Units Subcutaneous TID WC Meredith Staggers, MD   2 Units at 03/23/20 [phone]  . insulin aspart (novoLOG) injection 4 Units  4 Units Subcutaneous TID WC Meredith Staggers, MD   4 Units at 03/23/20 [phone]  . ipratropium-albuterol (DUONEB) 0.5-2.5 (3) MG/3ML nebulizer solution 3 mL  3 mL Nebulization Q2H PRN Love, Pamela S, PA-C      . lactulose (CHRONULAC) 10 GM/15ML solution 10 g  10 g Oral Daily Meredith Staggers, MD   10 g at 03/23/20 [phone]  . methylphenidate (RITALIN) tablet 5 mg  5 mg Oral BID Meredith Staggers, MD   5 mg at 03/23/20 8756  . metoprolol tartrate (LOPRESSOR) tablet 12.5 mg  12.5 mg Oral BID Meredith Staggers, MD   12.5 mg at 03/23/20 4332  . multivitamin with minerals tablet 1 tablet  1 tablet Oral Daily Meredith Staggers, MD   1 tablet at 03/23/20 [phone]  . nicotine (NICODERM CQ - dosed in mg/24 hours) patch 21 mg  21 mg Transdermal Daily Bary Leriche, PA-C   21 mg at 03/23/20 8416  . oxyCODONE (Oxy IR/ROXICODONE) immediate release tablet 5 mg  5 mg Oral Q6H PRN Meredith Staggers, MD      . prochlorperazine (COMPAZINE) tablet 5-10 mg  5-10 mg Per Tube Q6H PRN Bary Leriche, PA-C   10 mg at 03/19/20 1456   Or  . prochlorperazine (COMPAZINE) injection 5-10 mg  5-10 mg Intramuscular Q6H PRN Love, Pamela S, PA-C       Or  . prochlorperazine (COMPAZINE) suppository 12.5 mg  12.5 mg Rectal Q6H PRN Love, Pamela S, PA-C      . QUEtiapine (SEROQUEL) tablet 25 mg  25 mg Oral BID Meredith Staggers, MD   25 mg at 03/23/20 6063  . Resource ThickenUp Clear   Oral PRN Meredith Staggers, MD      . sodium phosphate (FLEET) 7-19 GM/118ML enema 1 enema  1 enema Rectal Once PRN Love, Pamela S, PA-C      . traZODone (DESYREL) tablet 25-50 mg  25-50 mg Per Tube QHS PRN Love, Pamela S, PA-C   50 mg at 03/13/20 2250     Discharge Medications: Please see discharge summary for a list of discharge medications.  Relevant Imaging Results:  Relevant Lab Results:   Additional Information    Rana Snare, LCSW

## 2020-03-23 NOTE — Progress Notes (Signed)
Physical Therapy Weekly Progress Note  Patient Details  Name: Phillip Barrett MRN: [id number] Date of Birth: June 04, 1976  Beginning of progress report period: March 16, 2020 End of progress report period: March 23, 2020  Today's Date: 03/23/2020 PT Individual Time: 1050-1155 PT Individual Time Calculation (min): 65 min   Patient has met 2 of 3 short term goals.  Patient with steady progress this week. Currently requires supervision for bed mobility, CGA for transfer, CGA for gait 75 ft with w/c follow due to decreased activity tolerance, and min A for 4-6" steps using B rails. Patient continues to present with decreased L LE>UR hemiplegia with absent proprioception in L LE. He also presents with decreased orientation and attention, increased lethargy/fatigue, and poor frustration tolerance. Patient appears to respond well to consistent staff.  Patient continues to demonstrate the following deficits muscle weakness, decreased cardiorespiratoy endurance, abnormal tone, decreased coordination and decreased motor planning, decreased attention, decreased awareness, decreased problem solving, decreased safety awareness and decreased memory and decreased standing balance, decreased postural control, hemiplegia and decreased balance strategies and therefore will continue to benefit from skilled PT intervention to increase functional independence with mobility.  Patient progressing toward long term goals..  Continue plan of care.  PT Short Term Goals Week 2:  PT Short Term Goal 1 (Week 2): Patient will perform basic transfers with CGA using LRAD. PT Short Term Goal 1 - Progress (Week 2): Met PT Short Term Goal 2 (Week 2): Patient will ambulate >150 ft with min A using LRAD. PT Short Term Goal 2 - Progress (Week 2): Progressing toward goal PT Short Term Goal 3 (Week 2): Patient will perform 4 steps using B rails with min A. PT Short Term Goal 3 - Progress (Week 2): Met Week 3:  PT Short Term Goal 1  (Week 3): STG=LTG due to patient pending SNF placement  Skilled Therapeutic Interventions/Progress Updates:     Patient in bed asleep upon PT arrival. Patient difficult to arouse to verbal and tactile stimulation initially, eventually patient aroused and agreeable to PT session. Patient denied pain during session. Patient required significant time and encouragement to participate in all mobility throughout session today due to fatigue.   Therapeutic Activity: Bed Mobility: Patient performed supine to/from sit with supervision with min use of bed rails. Provided verbal cues for reduced use of bed rails to simulate home set-up. Transfers: Patient performed sit to/from stand x4 and stand pivot x2 using RW or // bars with CGA-min A. Provided verbal cues for hand placement, forward weight shift, hip/knee extension to stand, and controlled descent for safety.  Gait Training:  Patient ambulated 86 feet using RW with CGA-min A for balance. Ambulated with significantly decreased gait speed, step-to gait pattern leading with L with intermittent reciprocal pattern, decreased step length and stance time on L, mild L knee instability without buckling and intermittent extensor thrust x4. Provided verbal cues for erect posture, increased gait speed, increased L quad and gluteal activation with PNF tapping to quads for improved knee control, and significant encouragement and reassurance that his L knee would not buckle with PT blocking L knee for patient's comfort with fatigue.  Wheelchair Mobility:  Patient was transported in the w/c with total A throughout session for energy conservation and time management.  Neuromuscular Re-ed: Patient performed the following dynamic gait activities in the // bars with B UE support and CGA for balance/safety: -forwards/backwards walking 10 ft x3 -side stepping R/L 10 ft x2 Provided cues for technique, sequencing,  and increased backwards and side step length for increased  muscle activation with activity.  Patient in recliner with Telesitter in the room at end of session with breaks locked, chair alarm and seat belt alarm set, and all needs within reach. Educated patient on increased time sitting OOB to encourage day time arousal. Patient stated understanding and agreeable to stay OOB through lunch.    Therapy Documentation Precautions:  Precautions Precautions: Fall Restrictions Weight Bearing Restrictions: No General: PT Amount of Missed Time (min): 10 Minutes PT Missed Treatment Reason: Patient fatigue Vital Signs: Therapy Vitals Temp: 98.1 F (36.7 C) Pulse Rate: 90 Resp: 18 BP: 127/76 Patient Position (if appropriate): Lying Oxygen Therapy SpO2: 100 %  Therapy/Group: Individual Therapy  Jizel Cheeks L Kamber Vignola PT, DPT  03/23/2020, 5:14 PM

## 2020-03-23 NOTE — Progress Notes (Signed)
Patient ID: Phillip Barrett, male   DOB: 1976/07/11, 44 y.o.   MRN: [id number]   NCPASRR#: [id number]  SW spoke Wanda/Admissions with [facility] [phone]) to discuss bed offer. States for SW to resend info in hub and will review. SW waiting on follow-up.   Loralee Pacas, MSW, [place] Office: [phone] Cell: [phone] Fax: [phone]

## 2020-03-24 ENCOUNTER — Inpatient Hospital Stay (HOSPITAL_COMMUNITY): Payer: Medicare Other

## 2020-03-24 LAB — GLUCOSE, CAPILLARY
Glucose-Capillary: 129 mg/dL — ABNORMAL HIGH (ref 70–99)
Glucose-Capillary: 144 mg/dL — ABNORMAL HIGH (ref 70–99)
Glucose-Capillary: 148 mg/dL — ABNORMAL HIGH (ref 70–99)
Glucose-Capillary: 168 mg/dL — ABNORMAL HIGH (ref 70–99)
Glucose-Capillary: 190 mg/dL — ABNORMAL HIGH (ref 70–99)
Glucose-Capillary: 214 mg/dL — ABNORMAL HIGH (ref 70–99)
Glucose-Capillary: 227 mg/dL — ABNORMAL HIGH (ref 70–99)

## 2020-03-24 MED ORDER — OXYCODONE HCL 5 MG PO TABS: 5.0000 mg | ORAL_TABLET | Freq: Every day | ORAL | 0 refills | Status: AC | PRN

## 2020-03-24 MED ORDER — LACTULOSE 10 GM/15ML PO SOLN: 10.0000 g | Freq: Every day | ORAL | 0 refills | Status: AC

## 2020-03-24 MED ORDER — QUETIAPINE FUMARATE 25 MG PO TABS: 25.0000 mg | ORAL_TABLET | Freq: Two times a day (BID) | ORAL | Status: AC

## 2020-03-24 MED ORDER — CHLORHEXIDINE GLUCONATE 0.12 % MT SOLN: 15.0000 mL | Freq: Two times a day (BID) | OROMUCOSAL | 0 refills | Status: AC

## 2020-03-24 MED ORDER — RESOURCE THICKENUP CLEAR PO POWD: 1.0000 g | ORAL | Status: AC | PRN

## 2020-03-24 MED ORDER — TRAZODONE HCL 50 MG PO TABS: 25.0000 mg | ORAL_TABLET | Freq: Every evening | ORAL | Status: AC | PRN

## 2020-03-24 MED ORDER — METHYLPHENIDATE HCL 5 MG PO TABS: 5.0000 mg | ORAL_TABLET | Freq: Two times a day (BID) | ORAL | 0 refills | Status: AC

## 2020-03-24 MED ORDER — IPRATROPIUM-ALBUTEROL 0.5-2.5 (3) MG/3ML IN SOLN: 3.0000 mL | Freq: Four times a day (QID) | RESPIRATORY_TRACT | Status: AC | PRN

## 2020-03-24 MED ORDER — INSULIN ASPART 100 UNIT/ML ~~LOC~~ SOLN
5.0000 [IU] | Freq: Three times a day (TID) | SUBCUTANEOUS | Status: DC
  Administered 2020-03-24 – 2020-03-25 (×3): 5 [IU] via SUBCUTANEOUS

## 2020-03-24 MED ORDER — INSULIN ASPART 100 UNIT/ML ~~LOC~~ SOLN: 5.0000 [IU] | Freq: Three times a day (TID) | SUBCUTANEOUS | 11 refills | Status: DC

## 2020-03-24 MED ORDER — METOPROLOL TARTRATE 25 MG PO TABS: 12.5000 mg | ORAL_TABLET | Freq: Two times a day (BID) | ORAL | Status: AC

## 2020-03-24 MED ORDER — DM-GUAIFENESIN ER 30-600 MG PO TB12: 1.0000 | ORAL_TABLET | Freq: Two times a day (BID) | ORAL | Status: AC

## 2020-03-24 NOTE — Progress Notes (Signed)
Occupational Therapy Session Note  Patient Details  Name: AKON REINOSO MRN: [id number] Date of Birth: 01-Jul-1976  Today's Date: 03/24/2020 OT Individual Time: 0700-0757 OT Individual Time Calculation (min): 57 min    Short Term Goals: Week 2:  OT Short Term Goal 1 (Week 2): Pt will bathe with CGA only for standing balance washing 11/11 body parts without physical A OT Short Term Goal 1 - Progress (Week 2): Met OT Short Term Goal 2 (Week 2): Pt will thread BLE into pants without VC for adaptive strategies to demo improved problem solving OT Short Term Goal 2 - Progress (Week 2): Progressing toward goal OT Short Term Goal 3 (Week 2): Pt will transfer to toielt wiht MIN A and LRAD OT Short Term Goal 3 - Progress (Week 2): Met OT Short Term Goal 4 (Week 2): Pt will don B socks with CGA OT Short Term Goal 4 - Progress (Week 2): Met Week 3:  OT Short Term Goal 1 (Week 3): Pt will bathe with S to demo improved knee stability/strengthening with dynamic standing OT Short Term Goal 2 (Week 3): Pt will don B socks with min VC for adaptive strategies OT Short Term Goal 3 (Week 3): Pt will transfer to toilet wiht CGA and LRAD OT Short Term Goal 4 (Week 3): Pt will thread BLE into pants without VC for adaptive strategies to demo improved problem solving  Skilled Therapeutic Interventions/Progress Updates:    1:1. Pt received in bed reporting he is "terrible" and "everyone got their food except me." pt agreeable to sitting EOB for eating breakfast. Pt able to manage packages and containers with BUE with increased time to open plastic food contianers and self set up tray with all items placed L of center for L attention. Pt demo decreased spatial organization of tray items often setting up one item on top of another, then realizing items needed to be moved to make space. Pt requires min VC for swallowing strategies outlined in SLP feeding plan and no overt s/s of aspiration when consuming dysphagia 1  textures and nectar thick liquids. TV allowed to be on/door open initially to work on selective attention. Pt does not look at TV however looks up every time someone walks in the hall past door. Pt able to return back to meal without cuing. Pt with improved bite size this date requiring less intervention for pacing/bite size than last time OT supervised self feeding. Pt completes alternating attention task in quiet dayroom with overall max VC for alternating between 2 tabletop cognitive tasks with VC for alternating pattern and attending to tasks not doors opening/closing in hallway. Exited session with pt seated in bed, exit alarm on and call light tin reach  Therapy Documentation Precautions:  Precautions Precautions: Fall Precaution Comments: NG tube Restrictions Weight Bearing Restrictions: No Other Position/Activity Restrictions: HOB >30 degrees General:   Vital Signs: Therapy Vitals Temp: 98.3 F (36.8 C) Pulse Rate: 75 Resp: 17 BP: 99/65 Patient Position (if appropriate): Lying Oxygen Therapy SpO2: 100 % Pain:   ADL: ADL Eating: NPO Grooming: Moderate assistance Where Assessed-Grooming: Edge of bed (suction toothbrush) Upper Body Bathing: Minimal assistance Where Assessed-Upper Body Bathing: Edge of bed Lower Body Bathing: Maximal assistance Where Assessed-Lower Body Bathing: Edge of bed Upper Body Dressing: Maximal cueing Where Assessed-Upper Body Dressing: Edge of bed Lower Body Dressing: Maximal assistance Where Assessed-Lower Body Dressing: Edge of bed Toileting: Maximal assistance (stedy) Where Assessed-Toileting: Bedside Commode Toilet Transfer: Moderate verbal cueing (stedy) Armed forces technical officer  Equipment: Art gallery manager    Praxis   Exercises:   Other Treatments:     Therapy/Group: Individual Therapy  Tonny Branch 03/24/2020, 7:08 AM
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
## 2020-03-24 NOTE — Progress Notes (Signed)
Physical Therapy Discharge Summary  Patient Details  Name: Phillip Barrett MRN: [id number] Date of Birth: 1975/11/29  Today's Date: 03/24/2020 PT Individual Time: 1300-1415 PT Individual Time Calculation (min): 75 min    Patient has met 7 of 13 long term goals due to improved activity tolerance, improved balance, improved postural control, increased strength, ability to compensate for deficits, improved attention, improved awareness and improved coordination.  Patient to discharge at a wheelchair level ambulating short distances with RW Min Assist.   Patient's care partner unavailable to provide the necessary physical and cognitive assistance at discharge. Patient to d/c to SNF for continued progress with therapy before d/c to his father's residence.   Reasons goals not met: Patient with change in POC to d/c SNF and sudden placement limiting time for patient to progress towards goals. Patient at CGA-min A level for mobility ambulating up to 86 ft with RW.   Recommendation:  Patient will benefit from ongoing skilled PT services in skilled nursing facility setting to continue to advance safe functional mobility, address ongoing impairments in balance, L UE/LE strength and proprioception, functional mobility, gait and stair training, attention, awareness, patient/caregiver education, and minimize fall risk.  Equipment: No equipment provided, to be provided at next level of care  Reasons for discharge: discharge from hospital  Patient/family agrees with progress made and goals achieved: Yes  Skilled Therapeutic Intervention: Patient in bed upon PT arrival. Patient alert and agreeable to PT session. Patient denied pain during session. Patient asked to discuss his history of drug use and asked advice for staying "clean" at d/c. PT educated on finding support groups and services to help him maintain sobriety, also educated on changing environment and avoiding people that may trigger drug use or old  habits. Patient reports use of marijuana to relax and sleep at night, encouraged patient to speed with the doctor about factors leading to stress and poor sleep and addressing them with medical oversight, rather than through recreational drug use. Patient appreciative of education/advice and PT expressed appreciation for patient being open to discussing this topic.   Therapeutic Activity: Bed Mobility: Patient performed rolling R/L and supine to/from sit with mod I for increased time in a flat bed without use of bed rails and on a mat table.  Transfers: Patient performed sit to/from stand x2, stand pivot x1, and squat pivot with and without RW with supervision during session. Required 2 trials to stand on first attempt only. Provided verbal cues for hand placement on RW x1, forward weight shift, and scooting forward to stand x1. Patient performed toileting in standing to void x1 during session.  Patient performed a simulated sedan height car transfer with min A-CGA using RW. Provided cues for safe technique.  Gait Training:  Patient ambulated 42 feet, limited by urinary urgency, using RW with CGA and w/c follow due to decreased activity tolerance. Ambulated as described below. Provided verbal cues for erect posture, looking ahead, L quad and gluteal activation in stance, and sequencing x3 at beginning of ambulation trial. Patient ascended/descended 4 steps using B rails with min A. Performed step-to gait pattern leading with R while ascending and L while descending. Provided cues for technique and sequencing.   Wheelchair Mobility:  Patient propelled wheelchair 156 feet with supervision. Provided verbal cues for equal propulsion with B UEs due to veering L secondary to L UE weakness. Required total A for donning/doffing leg rest throughout session. Required supervision for intermittent cues for appropriate use of breaks.  Neuromuscular Re-ed:  Patient performed the following standing balance  activities: -static standing x2 min with supervision, noted mild ankle strategies and increased L knee hyperextension with fatigue -dynamic standing balance 2x 1-2 min reaching to visual targets with L UE with CGA-min A for balance -L knee flexion/extension in sitting focused on increased power and initiation with contraction of each muscle group 2x10  Patient requires increased time and encouragement with all mobility due to decreased activity tolerance, initiation, and attention.   Patient in bed at end of session with breaks locked, bed alarm set, and all needs within reach.    PT Discharge Precautions/Restrictions Precautions Precautions: Fall Precaution Comments: moderate L hemi LE>UE Restrictions Weight Bearing Restrictions: No Vision/Perception  Vision - Assessment Eye Alignment: Within Functional Limits Alignment/Gaze Preference: Within Defined Limits Tracking/Visual Pursuits: Right eye does not track medially Saccades: Additional eye shifts occurred during testing;Additional head turns occurred during testing Convergence: Within functional limits Perception Perception: Impaired Inattention/Neglect: Does not attend to left visual field;Does not attend to left side of body Praxis Praxis: Impaired Praxis Impairment Details: Motor planning;Perseveration;Initiation Praxis-Other Comments: All deficites demonstrate improvement since admission  Cognition Overall Cognitive Status: Impaired/Different from baseline Arousal/Alertness: Lethargic Attention: Sustained Focused Attention: Impaired Focused Attention Impairment: Functional basic;Verbal basic Sustained Attention: Appears intact Sustained Attention Impairment: Verbal basic;Functional basic Memory: Impaired Memory Impairment: Decreased recall of new information;Decreased short term memory Decreased Short Term Memory: Verbal basic;Functional basic Awareness: Impaired Awareness Impairment: Intellectual impairment;Emergent  impairment Problem Solving: Impaired Problem Solving Impairment: Verbal basic;Functional basic Sequencing: Impaired Sequencing Impairment: Functional basic Initiating: Appears intact Initiating Impairment: Functional basic Behaviors: Perseveration;Restless;Poor frustration tolerance Safety/Judgment: Impaired [employer] Scales of Cognitive Functioning: Automatic/appropriate Sensation Sensation Light Touch: Appears Intact Proprioception: Impaired Detail Proprioception Impaired Details: Absent LLE;Impaired LUE Coordination Gross Motor Movements are Fluid and Coordinated: No Fine Motor Movements are Fluid and Coordinated: No Coordination and Movement Description: L hemiplegia and inattention LE>UE Heel Shin Test: unable on L due to weakness, WFL on R Motor  Motor Motor: Hemiplegia;Abnormal postural alignment and control Motor - Skilled Clinical Observations: L hemiplegia and inattention LE>UE  Mobility Bed Mobility Bed Mobility: Rolling Right;Rolling Left Rolling Right: Independent Rolling Left: Independent Supine to Sit: Independent Sit to Supine: Independent Transfers Sit to Stand: Supervision/Verbal cueing Stand to Sit: Supervision/Verbal cueing Stand Pivot Transfers: Supervision/Verbal cueing Stand Pivot Transfer Details: Verbal cues for safe use of DME/AE;Verbal cues for precautions/safety;Verbal cues for sequencing;Verbal cues for technique;Verbal cues for gait pattern Squat Pivot Transfers: Supervision/Verbal cueing Transfer (Assistive device): Rolling walker Locomotion  Gait Ambulation: Yes Gait Assistance: Contact Guard/Touching assist Gait Distance (Feet): 86 Feet Assistive device: Rolling walker Gait Assistance Details: Verbal cues for gait pattern;Verbal cues for technique;Verbal cues for precautions/safety;Verbal cues for safe use of DME/AE;Verbal cues for sequencing Gait Gait Pattern: Step-through pattern;Decreased step length - left;Decreased stance  time - left;Decreased stride length;Decreased hip/knee flexion - left;Decreased weight shift to left;Left genu recurvatum;Lateral hip instability;Trunk rotated posteriorly on left;Narrow base of support;Trunk flexed Gait velocity: significantly decreased High Level Ambulation High Level Ambulation: Side stepping;Backwards walking Side Stepping: CGA in // bars Backwards Walking: CGA in // bars Stairs / Additional Locomotion Stairs: Yes Stairs Assistance: Minimal Assistance - Patient > 75% Stair Management Technique: Two rails Number of Stairs: 4 Height of Stairs: 6 Wheelchair Mobility Wheelchair Mobility: Yes Wheelchair Assistance: Chartered loss adjuster: Both upper extremities Wheelchair Parts Management: Needs assistance Distance: 156 ft, veers L due to L UE weakness  Trunk/Postural Assessment  Cervical Assessment Cervical Assessment: Exceptions to  WFL (gaze R) Thoracic Assessment Thoracic Assessment: Exceptions to [facility] (rounded shoulders) Lumbar Assessment Lumbar Assessment: Exceptions to [facility] (post pelvic tilt) Postural Control Postural Control: Deficits on evaluation  Balance Static Sitting Balance Static Sitting - Level of Assistance: 6: Modified independent (Device/Increase time) Dynamic Sitting Balance Dynamic Sitting - Level of Assistance: 5: Stand by assistance Static Standing Balance Static Standing - Balance Support: Right upper extremity supported;During functional activity Static Standing - Level of Assistance: 5: Stand by assistance Dynamic Standing Balance Dynamic Standing - Level of Assistance: 4: Min assist Extremity Assessment  RUE Assessment RUE Assessment: Within Functional Limits LUE Assessment General Strength Comments: full ROM and 4-/5 strength at all joints LUE Body System: Neuro Brunstrum levels for arm and hand: Arm;Hand Brunstrum level for arm: Stage V Relative Independence from Synergy Brunstrum level for hand: Stage VI  Isolated joint movements RLE Assessment RLE Assessment: Exceptions to [facility] Active Range of Motion (AROM) Comments: limited hip flexion and DF to neutral General Strength Comments: Grossly in sitting: 5/5 throughout LLE Assessment LLE Assessment: Exceptions to [facility] Active Range of Motion (AROM) Comments: limited hip flexion due to strength deficits General Strength Comments: Grossly in sitting: hip flexion 3-/5, knee extension 3+/5, knee flexion 3+/5, DF 3/5, PF 2+/5    Natalina Wieting L Sharyon Peitz PT, DPT  03/24/2020, 4:33 PM

## 2020-03-24 NOTE — Progress Notes (Signed)
Patient ID: Phillip Barrett, male   DOB: [birth year], 44 y.o.   MRN: [id number]   SW spokeWanda/Admissions with [facility] [phone]) to discuss bed offer. SW waiting on follow-up.  *SW received updates from [name] extending bed offer. SW informed on pt social situation with plans to d/c to [place] as long as he is physically able to care for self and can have extended periods of being alone since his father works. SW spoke with pt father Thayer Jew [phone]), and left message for  friend/pastor Lenna Sciara [phone]) to discuss discharge plan. SW informed pt father there will be updates on if pt is able to d/c tomorrow pending attending approval. SW spoke with LaTricia/rep Payee with [facility] [phone])  to inform on SNF placement, and provided contact information.   [facility] ambulance pick up scheduled for 8/27 10am. Pt will d/c to room #120; nurse report [phone]. SW left message with Wanda/Admissions with [place] to provide all contact numbers above.   SW met with pt in room to discuss above. Pt remains amenable to SNF and will accept placement. SW reviewed discharge plan.  Loralee Pacas, MSW, [place] Office: [phone] Cell: [phone] Fax: [phone]
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
## 2020-03-24 NOTE — Progress Notes (Signed)
Speech Language Pathology Weekly Progress and Session Note  Patient Details  Name: KASTIEL SIMONIAN MRN: [id number] Date of Birth: 12/23/1975  Beginning of progress report period: March 17, 2020 End of progress report period: March 24, 2020  Today's Date: 03/24/2020 SLP Individual Time: 1100-1200 SLP Individual Time Calculation (min): 60 min  Short Term Goals: Week 2: SLP Short Term Goal 1 (Week 2): Patient will consume current diet with minimal overt s/s of aspiration and Min verbal cues for use of swallowing compensatory strategies. SLP Short Term Goal 1 - Progress (Week 2): Met SLP Short Term Goal 2 (Week 2): Patient will utilize speech intelligibility strategies at the sentence level with Mod A verbal cues to achieve ~75% intelligibility. SLP Short Term Goal 2 - Progress (Week 2): Progressing toward goal SLP Short Term Goal 3 (Week 2): Patient will utilize external aids to recall new, daily information with Mod A verbal and visual cues. SLP Short Term Goal 3 - Progress (Week 2): Progressing toward goal SLP Short Term Goal 4 (Week 2): Patient will demonstrate sustained attention to functional tasks for 30 minutes with Min verbal cues for redirection. SLP Short Term Goal 4 - Progress (Week 2): Met SLP Short Term Goal 5 (Week 2): Patient will demonstrate functional problem solving for basic and familiar tasks with Mod verbal and visual cues. SLP Short Term Goal 5 - Progress (Week 2): Progressing toward goal    New Short Term Goals: Week 3: SLP Short Term Goal 1 (Week 3): Pt will consume dysphagia 2 diet and NTL with  no overt s/sx of aspiration or penetration on 9/10 trials. SLP Short Term Goal 2 (Week 3): Patient will utilize speech intelligibility strategies at the sentence level with Mod A verbal cues to achieve ~75% intelligibility. SLP Short Term Goal 3 (Week 3): Patient will utilize external aids to recall new, daily information with Mod A verbal and visual cues. SLP Short Term Goal  4 (Week 3): Patient will demonstrate selective attention to functional tasks for 10 minutes with Min verbal cues for redirection. SLP Short Term Goal 5 (Week 3): Patient will demonstrate functional problem solving for basic and familiar tasks with Mod verbal and visual cues.  Weekly Progress Updates: Pt is making consistent progress with goals and has met 2/5 short term goals this reporting period due to increased tolerance with current diet (dys 1 and NTL) and improvement in sustained attention. Pt continues to require full supervision during meals for safety. Pt continues to require mod A for functional basic problem solving and intermittent redirection during structured tasks however requires less cues for redirection during extended periods of time. Pt would benefit from continued skilled SLP intervention to maximize cognition, safety, and independence prior to discharge.      Intensity: Minumum of 1-2 x/day, 30 to 90 minutes Frequency: 3 to 5 out of 7 days Duration/Length of Stay: 2-3 weeks Treatment/Interventions: Cognitive remediation/compensation;Cueing hierarchy;Dysphagia/aspiration precaution training;Functional tasks;Internal/external aids;Speech/Language facilitation;Environmental controls;Therapeutic Activities;Patient/family education;Therapeutic Exercise   Daily Session  Skilled Therapeutic Interventions: Skilled therapeutic intervention focused on dysphagia and cognition. Pt stated he was very sleepy and requested coffee to wake up. Oral care completed using sponge swab and suction. Pt sat at edge of bed for po trials. Pt consumed 5  Oz of thickened coffee during session. Demonstrated slow rate and single sips with no cueing required. Dysphagia 2 trials completed with pudding mixed with crush graham cracker. Throat clear noted x1 after swallow. Mastication and oral clearance for dys 2 snack were  adequate. Pt calculated bills and coins with min mod a verbal cues for error awareness.  Sustained attention task using BLINK cards and sorting into piles based on number of symbols shown completed with Supervision verbal cues for redirection. Pt responded to simple questions related to personal life and job during task. Redirection needed from cell phone x1 and cell phone charger x1 during 15 min task. Pt utilized speech intelligiblity strategies when producing sentences with max a verbal cues for overarticulation and open mouth strategy. Pt left in bed with bed alarm set and call bell within reach. Cont with therapy per plan of care.      General    Pain Pain Assessment Pain Scale: Faces Faces Pain Scale: No hurt  Therapy/Group: Individual Therapy  Darrol Poke Chaylee Ehrsam 03/24/2020, 1:42 PM

## 2020-03-24 NOTE — Progress Notes (Signed)
Occupational Therapy Discharge Summary  Patient Details  Name: SERGE MAIN MRN: [id number] Date of Birth: 1975/11/19   Patient has met 12 of 13 long term goals due to improved activity tolerance, improved balance, postural control, ability to compensate for deficits, functional use of  LEFT upper and LEFT lower extremity, improved attention, improved awareness and improved coordination.  Patient to discharge at overall Supervision level. Pt has made good progress this admission improving from a max-dependent level to S overall with transfers and BADLs. Pt is discharging SNF to continue to improve in areas of deficits in hopes to get back to a MOD I level prior to DC home or with family    Reasons goals not met: shower transfer goal not met as pt continues to need MIN A to step over shower ledge or ambulate into bathroom  Recommendation:  Patient will benefit from ongoing skilled OT services in skilled nursing facility setting to continue to advance functional skills in the area of BADL, iADL and Reduce care partner burden.  Equipment: No equipment provided  Reasons for discharge: treatment goals met and discharge from hospital  Patient/family agrees with progress made and goals achieved: Yes  OT Discharge Precautions/Restrictions  Precautions Precautions: Fall Restrictions Weight Bearing Restrictions: No General Chart Reviewed: Yes Family/Caregiver Present: No Vital Signs Therapy Vitals Temp: (!) 97.5 F (36.4 C) Pulse Rate: 93 Resp: 17 BP: 102/78 Patient Position (if appropriate): Sitting Oxygen Therapy SpO2: 100 % O2 Device: Room Air Pain Pain Assessment Pain Scale: Faces Pain Score: 0-No pain Faces Pain Scale: No hurt ADL ADL Eating: Supervision/safety Grooming: Supervision/safety Where Assessed-Grooming: Edge of bed (suction toothbrush) Upper Body Bathing: Supervision/safety Where Assessed-Upper Body Bathing: Shower Lower Body Bathing:  Supervision/safety Where Assessed-Lower Body Bathing: Shower Upper Body Dressing: Supervision/safety Where Assessed-Upper Body Dressing: Chair Lower Body Dressing: Supervision/safety Where Assessed-Lower Body Dressing: Chair Toileting: Supervision/safety Where Assessed-Toileting: Bedside Commode Toilet Transfer: Close supervision Toilet Transfer Method: Stand pivot Science writer: Grab bars, Geophysical data processor: Minimal assistance Social research officer, government Method: Ambulating Vision Baseline Vision/History: Wears glasses Patient Visual Report: No change from baseline Vision Assessment?: Yes Eye Alignment: Within Functional Limits Alignment/Gaze Preference: Within Defined Limits Tracking/Visual Pursuits: Right eye does not track medially Convergence: Within functional limits Perception  Perception: Impaired (improved since eval) Inattention/Neglect: Does not attend to left side of body;Does not attend to left visual field Praxis Praxis: Impaired Praxis Impairment Details: Motor planning;Perseveration;Initiation Praxis-Other Comments: significant improvements since baseline Cognition Overall Cognitive Status: Impaired/Different from baseline Arousal/Alertness: Lethargic Orientation Level: Oriented to person;Oriented to place Sustained Attention: Appears intact Memory: Impaired Problem Solving: Impaired Initiating: Appears intact Safety/Judgment: Impaired [place] [employer] Scales of Cognitive Functioning: Automatic/appropriate Sensation Sensation Light Touch: Appears Intact Proprioception: Impaired by gross assessment Coordination Gross Motor Movements are Fluid and Coordinated: No Fine Motor Movements are Fluid and Coordinated: No Motor  Motor Motor: Hemiplegia;Abnormal postural alignment and control Motor - Skilled Clinical Observations: mild L hemi Mobility  Bed Mobility Supine to Sit: Supervision/Verbal cueing Sit to Supine:  Supervision/Verbal cueing Transfers Sit to Stand: Supervision/Verbal cueing Stand to Sit: Supervision/Verbal cueing  Trunk/Postural Assessment  Cervical Assessment Cervical Assessment:  (forward head) Thoracic Assessment Thoracic Assessment:  (rounded shoudlers) Lumbar Assessment Lumbar Assessment:  (post pelvic tilt) Postural Control Postural Control: Deficits on evaluation  Balance Static Sitting Balance Static Sitting - Level of Assistance: 6: Modified independent (Device/Increase time) Dynamic Sitting Balance Dynamic Sitting - Level of Assistance: 5: Stand by assistance Static Standing Balance Static Standing -  Balance Support: Right upper extremity supported;During functional activity Static Standing - Level of Assistance: 5: Stand by assistance Dynamic Standing Balance Dynamic Standing - Level of Assistance: 4: Min assist Extremity/Trunk Assessment RUE Assessment RUE Assessment: Within Functional Limits LUE Assessment General Strength Comments: full ROM and 4-/5 strength at all joints LUE Body System: Neuro Brunstrum levels for arm and hand: Arm;Hand Brunstrum level for arm: Stage V Relative Independence from Synergy Brunstrum level for hand: Stage VI Isolated joint movements   Tonny Branch 03/24/2020, 4:23 PM

## 2020-03-25 DIAGNOSIS — S02609A Fracture of mandible, unspecified, initial encounter for closed fracture: Secondary | ICD-10-CM

## 2020-03-25 DIAGNOSIS — R7989 Other specified abnormal findings of blood chemistry: Secondary | ICD-10-CM

## 2020-03-25 LAB — GLUCOSE, CAPILLARY
Glucose-Capillary: 125 mg/dL — ABNORMAL HIGH (ref 70–99)
Glucose-Capillary: 185 mg/dL — ABNORMAL HIGH (ref 70–99)

## 2020-03-25 LAB — SARS CORONAVIRUS 2 (TAT 6-24 HRS): SARS Coronavirus 2: NEGATIVE

## 2020-03-25 MED ORDER — INSULIN ASPART 100 UNIT/ML ~~LOC~~ SOLN: 6.0000 [IU] | Freq: Three times a day (TID) | SUBCUTANEOUS | 11 refills | Status: AC

## 2020-03-25 MED ORDER — INSULIN ASPART 100 UNIT/ML ~~LOC~~ SOLN
6.0000 [IU] | Freq: Three times a day (TID) | SUBCUTANEOUS | Status: DC
  Administered 2020-03-25: 6 [IU] via SUBCUTANEOUS

## 2020-03-25 NOTE — Progress Notes (Signed)
Patient ID: Phillip Barrett, male   DOB: 28-Sep-1975, 44 y.o.   MRN: [id number]  Discharge delayed due to waiting on COVID test results. SW spoke with lab [phone]) to discuss results, and reports will assist with getting lab around 11am. SW rescheduled PTAR [phone]) for 12pm pick up. SW informed Wanda/Admissions with [employer] [phone] cell/[phone]) to inform on changes.   SW spoke with pt fatherWalter [phone]) to inform on above with Rm# and contact information for facility. Medical team aware of changes.  Loralee Pacas, MSW, [place] Office: [phone] Cell: [phone] Fax: [phone]

## 2020-03-25 NOTE — Progress Notes (Signed)
Inpatient Rehabilitation Care Coordinator  Discharge Note  The overall goal for the admission was met for:   Discharge location: Yes. Pt d/c to SNF- [facility] Rm#[id number]; nurse report [phone]  Length of Stay: Yes. 16 days.   Discharge activity level: Yes. Min A  Home/community participation: Yes. Limited.   Services provided included: MD, RD, PT, OT, SLP, RN, CM, TR, Pharmacy, Neuropsych and SW  Financial Services: Medicare and Medicaid  Follow-up services arranged: Other: N/A  Comments (or additional information):  Patient/Family verbalized understanding of follow-up arrangements: Yes  Individual responsible for coordination of the follow-up plan: N/A  Confirmed correct DME delivered: Rana Snare 03/25/2020    Rana Snare

## 2020-03-25 NOTE — Plan of Care (Signed)
  Problem: Consults Goal: RH BRAIN INJURY PATIENT EDUCATION Description: Description: See Patient Education module for eduction specifics Outcome: Completed/Met Goal: Nutrition Consult-if indicated Outcome: Completed/Met   Problem: RH BOWEL ELIMINATION Goal: RH STG MANAGE BOWEL WITH ASSISTANCE Description: STG Manage Bowel with min Assistance. Outcome: Completed/Met   Problem: RH BLADDER ELIMINATION Goal: RH STG MANAGE BLADDER WITH ASSISTANCE Description: STG Manage Bladder With min Assistance Outcome: Completed/Met Goal: RH STG MANAGE BLADDER WITH EQUIPMENT WITH ASSISTANCE Description: STG Manage Bladder With Equipment With min Assistance Outcome: Completed/Met   Problem: RH SKIN INTEGRITY Goal: RH STG SKIN FREE OF INFECTION/BREAKDOWN Description: Skin will remain free of infection/breakdown with min assist Outcome: Completed/Met Goal: RH STG MAINTAIN SKIN INTEGRITY WITH ASSISTANCE Description: STG Maintain Skin Integrity With min Assistance. Outcome: Completed/Met   Problem: RH SAFETY Goal: RH STG ADHERE TO SAFETY PRECAUTIONS W/ASSISTANCE/DEVICE Description: STG Adhere to Safety Precautions With min Assistance/Device. Outcome: Completed/Met   Problem: RH COGNITION-NURSING Goal: RH STG USES MEMORY AIDS/STRATEGIES W/ASSIST TO PROBLEM SOLVE Description: STG Uses Memory Aids/Strategies With min Assistance to Problem Solve. Outcome: Completed/Met   Problem: RH PAIN MANAGEMENT Goal: RH STG PAIN MANAGED AT OR BELOW PT'S PAIN GOAL Description: Pt will be able to verbalize pain less than 3 out of 10 with min assist  Outcome: Completed/Met   Problem: RH KNOWLEDGE DEFICIT BRAIN INJURY Goal: RH STG INCREASE KNOWLEDGE OF SELF CARE AFTER BRAIN INJURY Description: Pt and family will be able to identify 3 ways to improve pt safety awareness when returning home with min assist.  Outcome: Completed/Met

## 2020-03-25 NOTE — Progress Notes (Addendum)
1126-Writer attempted to call report to receiving facility x3, transferred to business office voicemail each time, unable to reach live person. Will try again.   1209-Report called to [place] at [facility]. Patient packed and ready, awaiting PTAR.   1446- Left unit to [facility] with PT[facility]
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
## 2020-03-25 NOTE — Progress Notes (Signed)
Speech Language Pathology Discharge Summary  Patient Details  Name: Phillip Barrett MRN: [id number] Date of Birth: 1976/01/21  Patient has met 8 of 9 long term goals.  Patient to discharge at [facility] level.   Reasons goals not met: Patient continues to require Mod-Max A verbal cues for use of speech intelligibility strategies.  Clinical Impression/Discharge Summary: Patient has made functional gains and has met 8 of 9 LTGs this admission. Currently, patient is consuming Dys. 1 textures with nectar-thick liquids with minimal overt s/s of aspiration and supervision verbal cues for use of swallowing compensatory strategies. Patient demonstrates improved cognitive functioning and requires overall Min A verbal cues to complete basic, familiar tasks safely in regards to attention, problem solving, and awareness and recall. Patient continues to demonstrate decreased speech intelligibility with overall Mod A verbal cues needed for use of speech intelligibility strategies. Patient's family is unable to provide the necessary physical and cognitive assistance at this time, therefore, patient will discharge to a SNF. Patient would benefit from f/u intervention to maximize his cognitive, swallowing and speech function and overall functional independence.   Care Partner:  Caregiver Able to Provide Assistance: No  Type of Caregiver Assistance: Physical;Cognitive  Recommendation:  Skilled Nursing facility  Rationale for SLP Follow Up: Maximize functional communication;Maximize cognitive function and independence;Maximize swallowing safety;Reduce caregiver burden   Equipment: Thickener   Reasons for discharge: Discharged from hospital   Patient/Family Agrees with Progress Made and Goals Achieved: Yes    [place], [place] 03/25/2020, 6:14 AM

## 2020-04-13 ENCOUNTER — Encounter: Payer: Medicare Other | Admitting: Physical Medicine & Rehabilitation
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
## 2021-06-06 IMAGING — DX DG CHEST 1V PORT
1 series · 1 of 1 positions shown · non-contrast
Comparison: 10/29/2019

CLINICAL DATA: Cough

EXAM:
PORTABLE CHEST 1 VIEW

[chest ap]
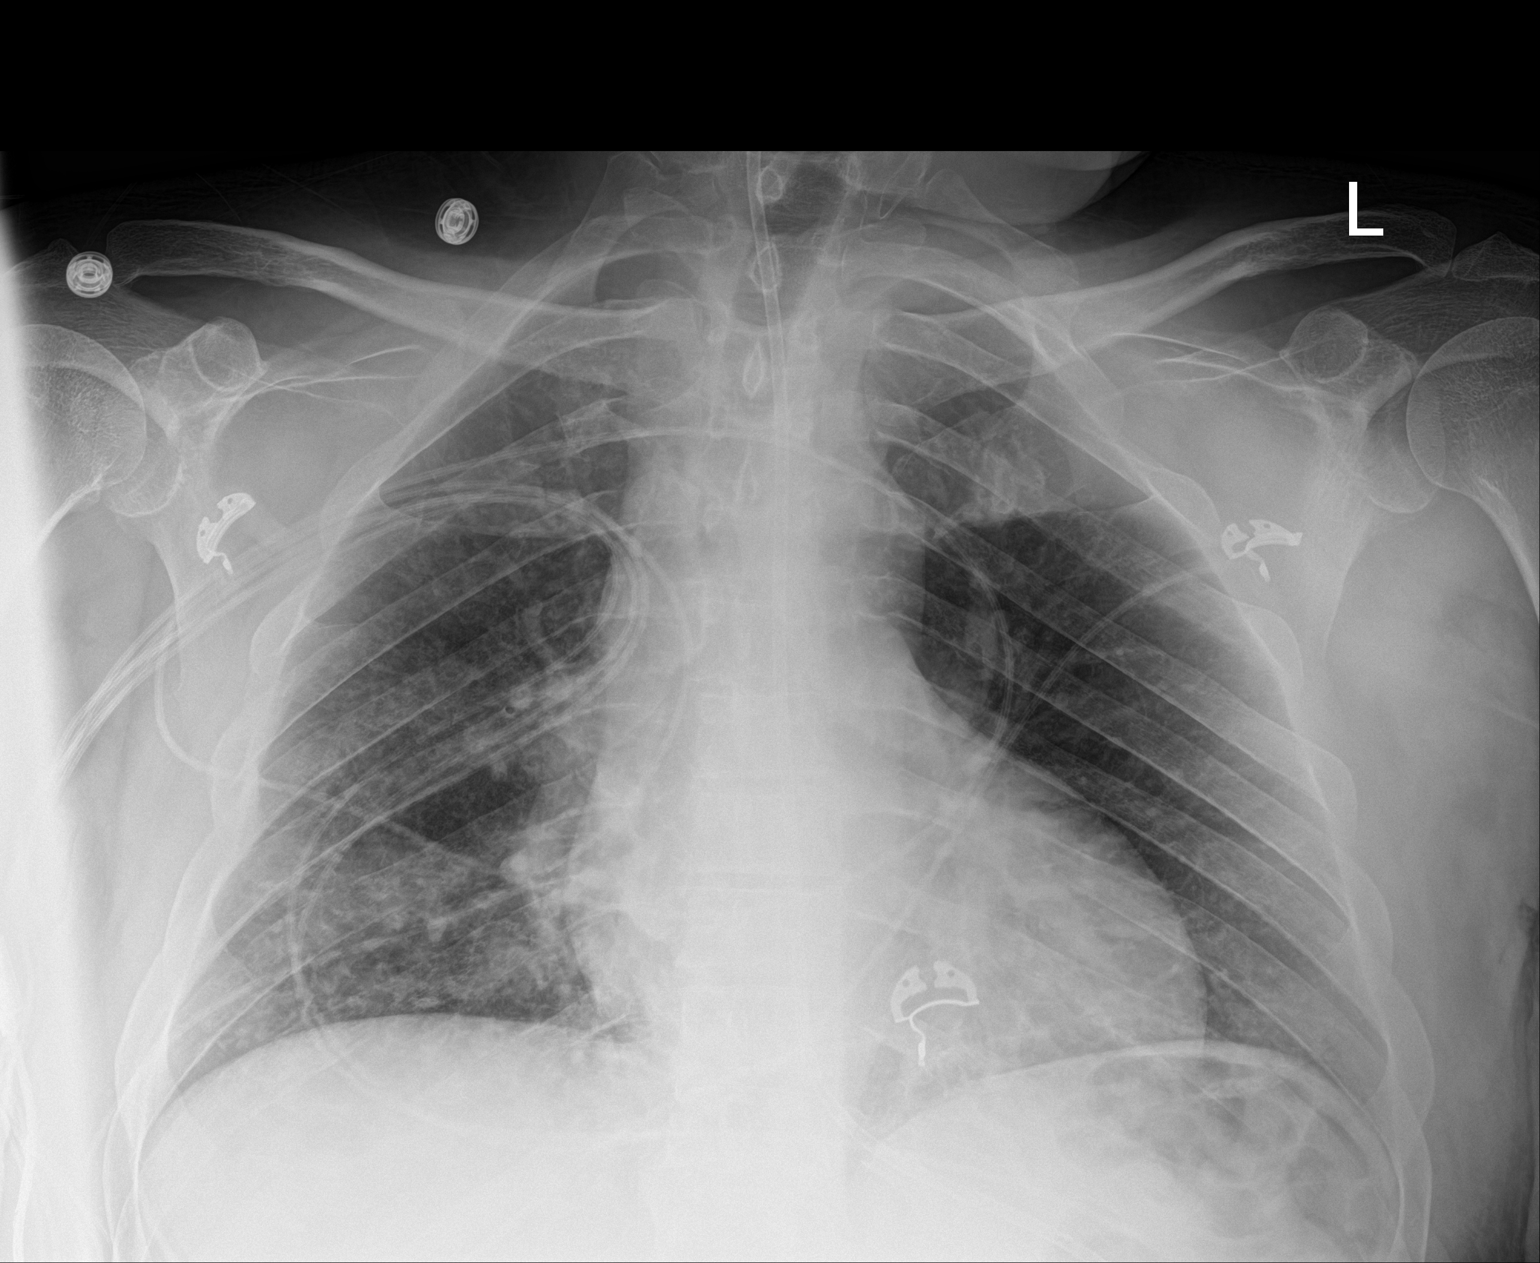

[1 of 1 positions shown; findings below may reference images not displayed]

FINDINGS: The heart size and mediastinal contours are within normal limits. No
focal consolidation, pleural effusion or pneumothorax. Chronic mild
right basilar interstitial thickening. The visualized skeletal
structures are unremarkable.

Feeding tube coursing below the diaphragm.
IMPRESSION: No active disease.

## 2021-06-11 IMAGING — DX DG CHEST 1V PORT
1 series · 1 of 1 positions shown · non-contrast
Comparison: 02/29/2020

CLINICAL DATA: Sudden low-grade fever.

EXAM:
PORTABLE CHEST 1 VIEW

[chest ap]
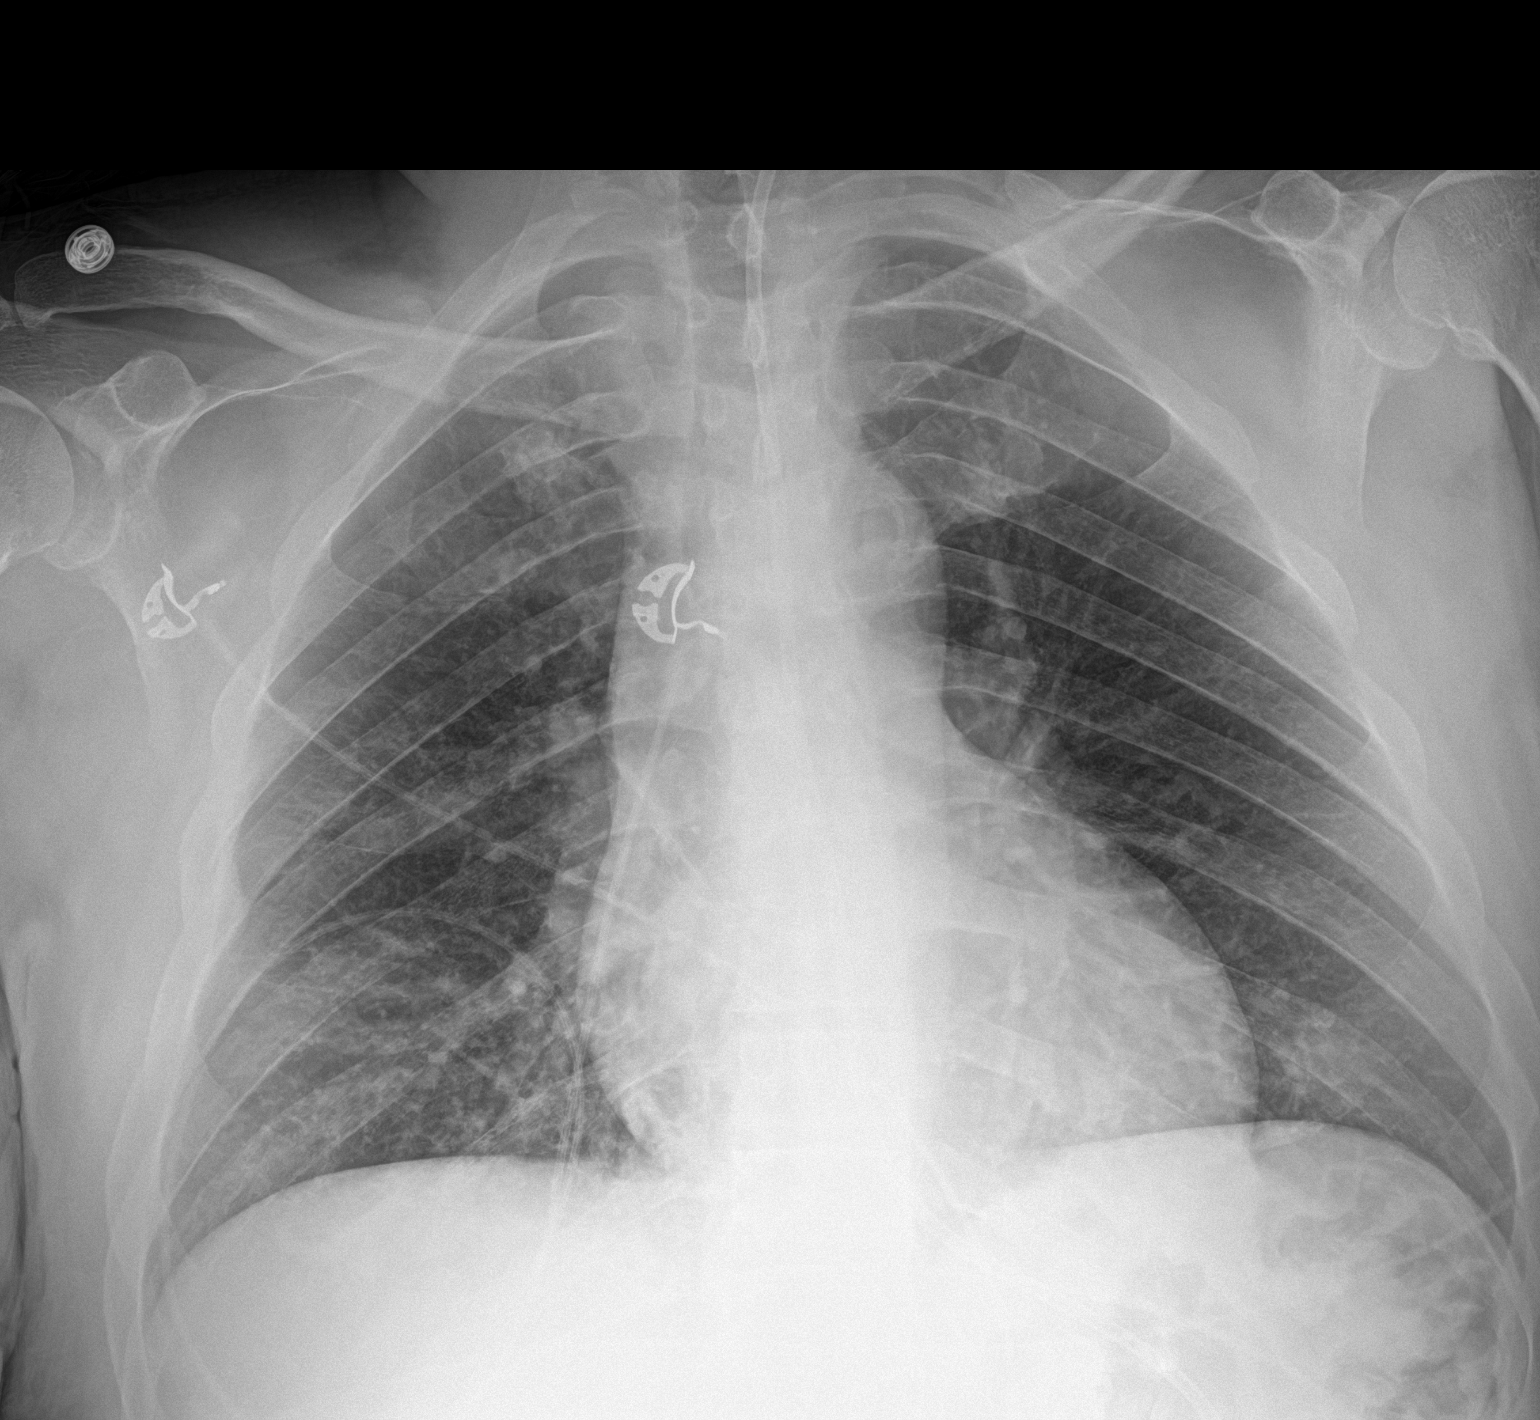

[1 of 1 positions shown; findings below may reference images not displayed]

FINDINGS: Feeding tube is in place, tip beyond the gastroesophageal junction
off the image. Heart size is normal. Reticulonodular opacities at
the lung bases may be chronic and appear stable compared study
10/29/2019. No new consolidations or pleural effusions. No evidence
for pulmonary edema.
IMPRESSION: No evidence for acute cardiopulmonary abnormality.
